# Patient Record
Sex: Female | Born: 1975
Health system: Southern US, Community
[De-identification: ages and names within clinical notes are randomized; demographics above are authoritative.]

## PROBLEM LIST (undated history)

## (undated) DIAGNOSIS — K469 Unspecified abdominal hernia without obstruction or gangrene: Secondary | ICD-10-CM

## (undated) DIAGNOSIS — F32A Depression, unspecified: Secondary | ICD-10-CM

## (undated) DIAGNOSIS — G8929 Other chronic pain: Secondary | ICD-10-CM

## (undated) DIAGNOSIS — R519 Headache, unspecified: Secondary | ICD-10-CM

## (undated) DIAGNOSIS — G030 Nonpyogenic meningitis: Secondary | ICD-10-CM

## (undated) DIAGNOSIS — E039 Hypothyroidism, unspecified: Secondary | ICD-10-CM

## (undated) DIAGNOSIS — G629 Polyneuropathy, unspecified: Secondary | ICD-10-CM

## (undated) DIAGNOSIS — A879 Viral meningitis, unspecified: Secondary | ICD-10-CM

## (undated) DIAGNOSIS — K219 Gastro-esophageal reflux disease without esophagitis: Secondary | ICD-10-CM

## (undated) DIAGNOSIS — M1990 Unspecified osteoarthritis, unspecified site: Secondary | ICD-10-CM

## (undated) DIAGNOSIS — F419 Anxiety disorder, unspecified: Secondary | ICD-10-CM

## (undated) HISTORY — DX: Hypothyroidism, unspecified: E03.9

## (undated) HISTORY — DX: Depression, unspecified: F32.A

## (undated) HISTORY — DX: Polyneuropathy, unspecified: G62.9

## (undated) HISTORY — DX: Viral meningitis, unspecified: A87.9

## (undated) HISTORY — DX: Unspecified osteoarthritis, unspecified site: M19.90

## (undated) HISTORY — PX: CHOLECYSTECTOMY: SHX55

## (undated) HISTORY — DX: Headache, unspecified: R51.9

## (undated) HISTORY — DX: Anxiety disorder, unspecified: F41.9

## (undated) HISTORY — DX: Other chronic pain: G89.29

## (undated) HISTORY — DX: Gastro-esophageal reflux disease without esophagitis: K21.9

## (undated) HISTORY — DX: Nonpyogenic meningitis: G03.0

## (undated) HISTORY — PX: THYROIDECTOMY, PARTIAL: SHX18

---

## 2002-04-30 HISTORY — PX: HERNIA REPAIR: SHX51

## 2008-11-12 ENCOUNTER — Ambulatory Visit (HOSPITAL_COMMUNITY): Admission: RE | Admit: 2008-11-12 | Discharge: 2008-11-12 | Payer: Self-pay | Admitting: Obstetrics and Gynecology

## 2008-12-03 ENCOUNTER — Ambulatory Visit (HOSPITAL_COMMUNITY): Admission: RE | Admit: 2008-12-03 | Discharge: 2008-12-03 | Payer: Self-pay | Admitting: Obstetrics and Gynecology

## 2009-06-28 HISTORY — PX: OTHER SURGICAL HISTORY: SHX169

## 2011-07-24 ENCOUNTER — Encounter (INDEPENDENT_AMBULATORY_CARE_PROVIDER_SITE_OTHER): Payer: Self-pay

## 2011-07-30 ENCOUNTER — Telehealth (INDEPENDENT_AMBULATORY_CARE_PROVIDER_SITE_OTHER): Payer: Self-pay | Admitting: General Surgery

## 2011-07-30 NOTE — Telephone Encounter (Signed)
Pt is calling to let Dr. Luisa Hart know she is experiencing a "burning sensation" at the site of the hernia, and it is tender to the touch.  I explained that Dr. Luisa Hart is on vacation this week, but I would send a note to his e-mail.  She denies that it is incarcerated, and understands if it becomes unable to reduce that she must go to ER immediately.

## 2011-07-31 ENCOUNTER — Emergency Department (HOSPITAL_COMMUNITY)
Admission: EM | Admit: 2011-07-31 | Discharge: 2011-07-31 | Disposition: A | Discharge status: Home / Self Care | Payer: Medicaid Other | Attending: Emergency Medicine | Admitting: Emergency Medicine

## 2011-07-31 ENCOUNTER — Emergency Department (HOSPITAL_COMMUNITY): Payer: Medicaid Other

## 2011-07-31 ENCOUNTER — Encounter (HOSPITAL_COMMUNITY): Payer: Self-pay | Admitting: *Deleted

## 2011-07-31 DIAGNOSIS — R109 Unspecified abdominal pain: Secondary | ICD-10-CM | POA: Insufficient documentation

## 2011-07-31 DIAGNOSIS — K469 Unspecified abdominal hernia without obstruction or gangrene: Secondary | ICD-10-CM

## 2011-07-31 HISTORY — DX: Unspecified abdominal hernia without obstruction or gangrene: K46.9

## 2011-07-31 LAB — CBC
HCT: 39.3 % (ref 36.0–46.0)
Hemoglobin: 13.4 g/dL (ref 12.0–15.0)
MCV: 88.7 fL (ref 78.0–100.0)
Platelets: 221 10*3/uL (ref 150–400)
RBC: 4.43 MIL/uL (ref 3.87–5.11)
WBC: 7.4 10*3/uL (ref 4.0–10.5)

## 2011-07-31 LAB — POCT I-STAT, CHEM 8
BUN: 10 mg/dL (ref 6–23)
Calcium, Ion: 1.26 mmol/L (ref 1.12–1.32)
Chloride: 108 mEq/L (ref 96–112)
Glucose, Bld: 70 mg/dL (ref 70–99)

## 2011-07-31 MED ORDER — MORPHINE SULFATE 4 MG/ML IJ SOLN
4.0000 mg | INTRAMUSCULAR | Status: DC | PRN
  Administered 2011-07-31: 4 mg via INTRAVENOUS
  Filled 2011-07-31: qty 1

## 2011-07-31 MED ORDER — OXYCODONE-ACETAMINOPHEN 5-325 MG PO TABS: 1.0000 | ORAL_TABLET | Freq: Four times a day (QID) | ORAL | Status: DC | PRN

## 2011-07-31 MED ORDER — ONDANSETRON HCL 4 MG/2ML IJ SOLN
4.0000 mg | Freq: Once | INTRAMUSCULAR | Status: AC
  Administered 2011-07-31: 4 mg via INTRAVENOUS
  Filled 2011-07-31: qty 2

## 2011-07-31 NOTE — ED Provider Notes (Signed)
History     CSN: [id number]  Arrival date & time 07/31/11  1330   First MD Initiated Contact with Patient 07/31/11 1451      Chief Complaint  Patient presents with  . Abdominal Pain  . Hernia    (Consider location/radiation/quality/duration/timing/severity/associated sxs/prior treatment) HPI Comments: Patient presents emergency Department with a chief complaint of abdominal hernia.  Patient states she has had surgical repair to this hernia 5 times the most recent surgery performed in September.  Patient has decided to change her surgeon to [facility] and has an appointment on April 8 however she started experiencing abdominal pain 2 days ago.  Patient was advised by the nurse at [facility] to come to the emergency department if abdominal pain worsens.  Patient denies nausea, vomiting, fevers, night sweats, chills.  Exacerbating symptoms of abdominal pain include any movement and certain positions.  Pain does not radiate and is rated at a 8/10 in severity.  Patient has no other complaints at this time.  Patient is a 36 y.o. female presenting with abdominal pain. The history is provided by the patient.  Abdominal Pain The primary symptoms of the illness include abdominal pain. The primary symptoms of the illness do not include fever, shortness of breath, nausea, vomiting, diarrhea, dysuria, vaginal discharge or vaginal bleeding.  Symptoms associated with the illness do not include chills, constipation, urgency or hematuria.    Past Medical History  Diagnosis Date  . Hernia     Past Surgical History  Procedure Date  . Hernia repair 2004  . Cholecystectomy   . Cesarean section     No family history on file.  History  Substance Use Topics  . Smoking status: Current Everyday Smoker  . Smokeless tobacco: Not on file  . Alcohol Use: No    OB History    Grav Para Term Preterm Abortions TAB SAB Ect Mult Living                  Review of Systems  Constitutional:  Negative for fever, chills and appetite change.  HENT: Negative for neck stiffness and dental problem.   Eyes: Negative for visual disturbance.  Respiratory: Negative for cough, chest tightness, shortness of breath and wheezing.   Cardiovascular: Negative for chest pain.  Gastrointestinal: Positive for abdominal pain. Negative for nausea, vomiting, diarrhea, constipation, blood in stool, abdominal distention, anal bleeding and rectal pain.  Genitourinary: Negative for dysuria, urgency, hematuria, flank pain, vaginal bleeding, vaginal discharge and vaginal pain.  Musculoskeletal: Negative for myalgias and arthralgias.  Skin: Negative for rash.  Neurological: Negative for dizziness, syncope, speech difficulty, numbness and headaches.  Hematological: Does not bruise/bleed easily.  All other systems reviewed and are negative.    Allergies  Review of patient's allergies indicates not on file.  Home Medications   Current Outpatient Rx  Name Route Sig Dispense Refill  . CETIRIZINE HCL 10 MG PO TABS Oral Take 10 mg by mouth daily.    Marland Kitchen CLONAZEPAM 0.5 MG PO TABS Oral Take 1 mg by mouth 3 (three) times daily as needed.    Marland Kitchen EPINEPHRINE 0.3 MG/0.3ML IJ DEVI Intramuscular Inject 0.3 mg into the muscle once.    Marland Kitchen FLUOXETINE HCL 40 MG PO CAPS Oral Take 40 mg by mouth daily.    Marland Kitchen HYDROCODONE-ACETAMINOPHEN 5-325 MG PO TABS Oral Take 1 tablet by mouth every 6 (six) hours as needed.      BP 106/75  Pulse 93  Temp(Src) 97.8 F (36.6 C) (  Oral)  Resp 18  Ht 5\' 6"  (1.676 m)  Wt 236 lb (107.049 kg)  BMI 38.09 kg/m2  SpO2 97%  LMP 07/02/2011  Physical Exam  Nursing note and vitals reviewed. Constitutional: Vital signs are normal. She appears well-developed and well-nourished. No distress.  HENT:  Head: Normocephalic and atraumatic.  Mouth/Throat: Uvula is midline, oropharynx is clear and moist and mucous membranes are normal.  Eyes: Conjunctivae and EOM are normal. Pupils are equal, round, and  reactive to light.  Neck: Normal range of motion and full passive range of motion without pain. Neck supple. No spinous process tenderness and no muscular tenderness present. No rigidity. No Brudzinski's sign noted.  Cardiovascular: Normal rate and regular rhythm.   Pulmonary/Chest: Effort normal and breath sounds normal. No accessory muscle usage. Not tachypneic. No respiratory distress.  Abdominal: Soft. Normal appearance. She exhibits no distension, no ascites, no pulsatile midline mass and no mass. There is tenderness. There is no CVA tenderness. No hernia.         Large abdominal hernia, ttp   Lymphadenopathy:    She has no cervical adenopathy.  Neurological: She is alert.  Skin: Skin is warm and dry. No rash noted. She is not diaphoretic.  Psychiatric: She has a normal mood and affect. Her speech is normal and behavior is normal.    ED Course  Procedures (including critical care time)   Labs Reviewed  CBC  POCT I-STAT, CHEM 8   Ct Abdomen Pelvis Wo Contrast  07/31/2011  *RADIOLOGY REPORT*  Clinical Data: Mid abdominal pain.  History of hernia.  CT ABDOMEN AND PELVIS WITHOUT CONTRAST  Technique:  Multidetector CT imaging of the abdomen and pelvis was performed following the standard protocol without intravenous contrast.  Comparison: 07/15/2007  Findings: Lung bases are clear.  No effusions.  Heart is normal size.  Prior cholecystectomy.  Liver, spleen, pancreas, adrenals and kidneys have an unremarkable unenhanced appearance.  Changes of prior anterior abdominal wall hernia repair.  Recurrent hernia inferiorly within the lower pelvis ventral wall containing small bowel loops.  There is fluid adjacent to the hernia sac, but this appears to be outside the hernia within the subcutaneous soft tissues.  This fluid is new since prior study.  No evidence of bowel obstruction.  Stomach and large bowel unremarkable.  Uterus, adnexa and urinary bladder have an unremarkable unenhanced appearance.   No acute bony abnormality.  IMPRESSION: Ventral hernia in the lower pelvic wall below the prior hernia repair clips.  This contains small bowel loops without evidence of obstruction.  Fluid is noted around the hernia sac, but it appears to be outside the sac within the subcutaneous soft tissues.  This is of unknown etiology or significance.  This could be reactive or related to postoperative seroma.  Original Report Authenticated By: Cyndie Chime, M.D.     No diagnosis found.    MDM  Hernia Patient with ventral abdominal hernia.  CT shows no evidence of obstruction.  Patients pain managed in the emergency department with morphine.  [facility] consult it and spoke with Dr. Johna Sheriff who is comfortable with patient keeping her appointment on April 8.  Patient will be sent home with Percocet for pain management patient is agreeable with plan.        Jaci Carrel, [place] 07/31/11 1717

## 2011-07-31 NOTE — ED Notes (Signed)
Pt has a lower abdominal hernia. Pt states she has had 6 hernias in the past 2 years. Pt states her surgeon told her to come into the ed if she was unable to push the hernia back into place. Pt states she is having more frequent urination.

## 2011-07-31 NOTE — Discharge Instructions (Signed)
Keep her followup appointment scheduled with [facility] for April 8.  As discussed return to the emergency department if he were hernia worsens in any way including pain not being controlled by the medications given, hardening of the hernia, or associated symptoms including vomiting.  Take medication as prescribed and to not operate heavy machinery within 4 hours and to use medication.  Hernia A hernia occurs when an internal organ pushes out through a weak spot in the abdominal wall. Hernias most commonly occur in the groin and around the navel. Hernias often can be pushed back into place (reduced). Most hernias tend to get worse over time. Some abdominal hernias can get stuck in the opening (irreducible or incarcerated hernia) and cannot be reduced. An irreducible abdominal hernia which is tightly squeezed into the opening is at risk for impaired blood supply (strangulated hernia). A strangulated hernia is a medical emergency. Because of the risk for an irreducible or strangulated hernia, surgery may be recommended to repair a hernia. CAUSES   Heavy lifting.   Prolonged coughing.   Straining to have a bowel movement.   A cut (incision) made during an abdominal surgery.  HOME CARE INSTRUCTIONS   Bed rest is not required. You may continue your normal activities.   Avoid lifting more than 10 pounds (4.5 kg) or straining.   Cough gently. If you are a smoker it is best to stop. Even the best hernia repair can break down with the continual strain of coughing. Even if you do not have your hernia repaired, a cough will continue to aggravate the problem.   Do not wear anything tight over your hernia. Do not try to keep it in with an outside bandage or truss. These can damage abdominal contents if they are trapped within the hernia sac.   Eat a normal diet.   Avoid constipation. Straining over long periods of time will increase hernia size and encourage breakdown of repairs. If you cannot do  this with diet alone, stool softeners may be used.  SEEK IMMEDIATE MEDICAL CARE IF:   You have a fever.   You develop increasing abdominal pain.   You feel nauseous or vomit.   Your hernia is stuck outside the abdomen, looks discolored, feels hard, or is tender.   You have any changes in your bowel habits or in the hernia that are unusual for you.   You have increased pain or swelling around the hernia.   You cannot push the hernia back in place by applying gentle pressure while lying down.  MAKE SURE YOU:   Understand these instructions.   Will watch your condition.   Will get help right away if you are not doing well or get worse.  Document Released: 04/16/2005 Document Revised: 04/05/2011 Document Reviewed: 12/04/2007 [facility] Patient Information 2012 [place], [place].

## 2011-08-03 NOTE — ED Provider Notes (Signed)
Medical screening examination/treatment/procedure(s) were performed by non-physician practitioner and as supervising physician I was immediately available for consultation/collaboration.   Ann Perez. Oletta Lamas, MD 08/03/11 2029

## 2011-08-06 ENCOUNTER — Other Ambulatory Visit (INDEPENDENT_AMBULATORY_CARE_PROVIDER_SITE_OTHER): Payer: Self-pay

## 2011-08-06 ENCOUNTER — Ambulatory Visit (INDEPENDENT_AMBULATORY_CARE_PROVIDER_SITE_OTHER): Payer: Medicaid Other | Admitting: Surgery

## 2011-08-06 ENCOUNTER — Encounter (INDEPENDENT_AMBULATORY_CARE_PROVIDER_SITE_OTHER): Payer: Self-pay | Admitting: Surgery

## 2011-08-06 VITALS — BP 110/77 | HR 77 | Temp 97.2°F | Ht 66.5 in | Wt 259.4 lb

## 2011-08-06 DIAGNOSIS — K432 Incisional hernia without obstruction or gangrene: Secondary | ICD-10-CM

## 2011-08-06 DIAGNOSIS — K439 Ventral hernia without obstruction or gangrene: Secondary | ICD-10-CM

## 2011-08-06 MED ORDER — OXYCODONE-ACETAMINOPHEN 10-325 MG PO TABS: 1.0000 | ORAL_TABLET | Freq: Four times a day (QID) | ORAL | Status: AC | PRN

## 2011-08-06 NOTE — Patient Instructions (Signed)
You will be referred to [facility] to see hernia expert.  Office will call you with info in next 1 -2 days.

## 2011-08-06 NOTE — Progress Notes (Signed)
Patient ID: Ann Perez, female   DOB: 02/02/1976, 36 y.o.   MRN: [id number]  Chief Complaint  Patient presents with  . Pre-op Exam    eval hernia    HPI Ann Perez is a 36 y.o. female.   HPI Patient sent at the request of Dr. Amada Kingfisher due to recurrent ventral hernia. The patient has had multiple ventral hernia repairs since 2004. She has had repairs in each of the last 5 years. To the operative reports come with her today. Her last repair was September 2012 in [place]. She had a previous C-section and has had multiple repairs of either of umbilical or incisional hernia since 2004. She's had a laparoscopic repair in 2009. In 2011 she had back mesh removed and underwent a panniculectomy with repair using a biologic prosthesis and abdominoplasty in [place]. Her hernias recurred in her lower abdomen and was seen last week at [facility] long where CT scan showed recurrence of hernia just above the pubis-containing small bowel contents without signs of incarceration or strangulation. She still has little bowel pain which is chronic. This comes and goes but is made worse with exertion. No change in bowel or bladder function.  Past Medical History  Diagnosis Date  . Hernia     Past Surgical History  Procedure Date  . Hernia repair 2004  . Cholecystectomy   . Cesarean section   . Paniculectomy 06/2009    Family History  Problem Relation Age of Onset  . Cancer Maternal Grandmother     breast  . Cancer Maternal Grandfather   . Cancer Paternal Grandmother     brain  . Cancer Cousin     ovarian    Social History History  Substance Use Topics  . Smoking status: Current Everyday Smoker -- 1.0 packs/day    Types: Cigarettes  . Smokeless tobacco: Not on file  . Alcohol Use: No    Allergies  Allergen Reactions  . Tape Hives and Rash    Steri strips    Current Outpatient Prescriptions  Medication Sig Dispense Refill  . cetirizine (ZYRTEC) 10 MG tablet Take 10 mg by mouth daily.       . clonazePAM (KLONOPIN) 0.5 MG tablet Take 1 mg by mouth 3 (three) times daily as needed.      Marland Kitchen EPINEPHrine (EPIPEN) 0.3 mg/0.3 mL DEVI Inject 0.3 mg into the muscle once.      Marland Kitchen FLUoxetine (PROZAC) 40 MG capsule Take 40 mg by mouth daily.      Marland Kitchen HYDROcodone-acetaminophen (NORCO) 5-325 MG per tablet Take 1 tablet by mouth every 6 (six) hours as needed.      Marland Kitchen oxyCODONE-acetaminophen (PERCOCET) 10-325 MG per tablet Take 1 tablet by mouth every 6 (six) hours as needed for pain.  30 tablet  0    Review of Systems Review of Systems  Constitutional: Negative for fever, chills and unexpected weight change.  HENT: Negative for hearing loss, congestion, sore throat, trouble swallowing and voice change.   Eyes: Negative for visual disturbance.  Respiratory: Negative for cough and wheezing.   Cardiovascular: Negative for chest pain, palpitations and leg swelling.  Gastrointestinal: Negative for nausea, vomiting, abdominal pain, diarrhea, constipation, blood in stool, abdominal distention and anal bleeding.  Genitourinary: Negative for hematuria, vaginal bleeding and difficulty urinating.  Musculoskeletal: Negative for arthralgias.  Skin: Negative for rash and wound.  Neurological: Negative for seizures, syncope and headaches.  Hematological: Negative for adenopathy. Does not bruise/bleed easily.  Psychiatric/Behavioral: Negative for confusion.  Blood pressure 110/77, pulse 77, temperature 97.2 F (36.2 C), temperature source Temporal, height 5' 6.5" (1.689 m), weight 259 lb 6.4 oz (117.663 kg), last menstrual period 07/02/2011, SpO2 98.00%.  Physical Exam Physical Exam  Constitutional: She appears well-developed and well-nourished.  HENT:  Head: Normocephalic and atraumatic.  Eyes: EOM are normal. Pupils are equal, round, and reactive to light.  Neck: Normal range of motion.  Cardiovascular: Normal rate and regular rhythm.   Pulmonary/Chest: Effort normal and breath sounds normal.    Abdominal:    Skin: Skin is warm and dry.  Psychiatric: She has a normal mood and affect. Her speech is normal and behavior is normal. Thought content normal.    Data Reviewed CT 07/31/2011  Recurrent incisional hernia containing small bowel above pubis.    Assessment    Recurrent incisional hernia after multiple repairs in [place]    Plan    Recommend referral to [facility]  For Hernia expert given multiple failed attempts and techniques.        Ann Perez A. 08/06/2011, 11:20 AM

## 2011-08-09 ENCOUNTER — Telehealth (INDEPENDENT_AMBULATORY_CARE_PROVIDER_SITE_OTHER): Payer: Self-pay

## 2011-08-09 NOTE — Telephone Encounter (Signed)
Left message for patient to call back to let her know due to her insurance she will have to have PCP put referral in to see Dr. Barnetta Chapel at [facility] for colorectal specialist.

## 2016-07-11 DIAGNOSIS — J0120 Acute ethmoidal sinusitis, unspecified: Secondary | ICD-10-CM | POA: Diagnosis not present

## 2016-07-11 DIAGNOSIS — G8929 Other chronic pain: Secondary | ICD-10-CM | POA: Diagnosis not present

## 2016-07-11 DIAGNOSIS — G43909 Migraine, unspecified, not intractable, without status migrainosus: Secondary | ICD-10-CM | POA: Diagnosis not present

## 2016-07-11 DIAGNOSIS — M549 Dorsalgia, unspecified: Secondary | ICD-10-CM | POA: Diagnosis not present

## 2016-10-22 DIAGNOSIS — G894 Chronic pain syndrome: Secondary | ICD-10-CM | POA: Diagnosis not present

## 2016-10-22 DIAGNOSIS — M5416 Radiculopathy, lumbar region: Secondary | ICD-10-CM | POA: Diagnosis not present

## 2016-10-22 DIAGNOSIS — M542 Cervicalgia: Secondary | ICD-10-CM | POA: Diagnosis not present

## 2016-10-22 DIAGNOSIS — M5090 Cervical disc disorder, unspecified, unspecified cervical region: Secondary | ICD-10-CM | POA: Diagnosis not present

## 2016-10-22 DIAGNOSIS — M519 Unspecified thoracic, thoracolumbar and lumbosacral intervertebral disc disorder: Secondary | ICD-10-CM | POA: Diagnosis not present

## 2016-10-22 DIAGNOSIS — E663 Overweight: Secondary | ICD-10-CM | POA: Diagnosis not present

## 2017-01-10 DIAGNOSIS — G43909 Migraine, unspecified, not intractable, without status migrainosus: Secondary | ICD-10-CM | POA: Diagnosis not present

## 2017-01-10 DIAGNOSIS — M549 Dorsalgia, unspecified: Secondary | ICD-10-CM | POA: Diagnosis not present

## 2017-01-10 DIAGNOSIS — F331 Major depressive disorder, recurrent, moderate: Secondary | ICD-10-CM | POA: Diagnosis not present

## 2017-01-10 DIAGNOSIS — G8929 Other chronic pain: Secondary | ICD-10-CM | POA: Diagnosis not present

## 2017-02-28 DIAGNOSIS — J0100 Acute maxillary sinusitis, unspecified: Secondary | ICD-10-CM | POA: Diagnosis not present

## 2017-05-24 DIAGNOSIS — M519 Unspecified thoracic, thoracolumbar and lumbosacral intervertebral disc disorder: Secondary | ICD-10-CM | POA: Diagnosis not present

## 2017-05-24 DIAGNOSIS — G894 Chronic pain syndrome: Secondary | ICD-10-CM | POA: Diagnosis not present

## 2017-05-24 DIAGNOSIS — M5412 Radiculopathy, cervical region: Secondary | ICD-10-CM | POA: Diagnosis not present

## 2017-05-24 DIAGNOSIS — M5090 Cervical disc disorder, unspecified, unspecified cervical region: Secondary | ICD-10-CM | POA: Diagnosis not present

## 2017-05-24 DIAGNOSIS — E663 Overweight: Secondary | ICD-10-CM | POA: Diagnosis not present

## 2017-05-24 DIAGNOSIS — M542 Cervicalgia: Secondary | ICD-10-CM | POA: Diagnosis not present

## 2017-05-24 DIAGNOSIS — M5416 Radiculopathy, lumbar region: Secondary | ICD-10-CM | POA: Diagnosis not present

## 2017-06-19 DIAGNOSIS — G894 Chronic pain syndrome: Secondary | ICD-10-CM | POA: Diagnosis not present

## 2017-06-19 DIAGNOSIS — M542 Cervicalgia: Secondary | ICD-10-CM | POA: Diagnosis not present

## 2017-06-19 DIAGNOSIS — M5416 Radiculopathy, lumbar region: Secondary | ICD-10-CM | POA: Diagnosis not present

## 2017-06-19 DIAGNOSIS — M5412 Radiculopathy, cervical region: Secondary | ICD-10-CM | POA: Diagnosis not present

## 2017-06-19 DIAGNOSIS — M5090 Cervical disc disorder, unspecified, unspecified cervical region: Secondary | ICD-10-CM | POA: Diagnosis not present

## 2017-06-19 DIAGNOSIS — M519 Unspecified thoracic, thoracolumbar and lumbosacral intervertebral disc disorder: Secondary | ICD-10-CM | POA: Diagnosis not present

## 2017-06-19 DIAGNOSIS — E663 Overweight: Secondary | ICD-10-CM | POA: Diagnosis not present

## 2017-09-27 DIAGNOSIS — M21612 Bunion of left foot: Secondary | ICD-10-CM | POA: Diagnosis not present

## 2017-09-27 DIAGNOSIS — Z85850 Personal history of malignant neoplasm of thyroid: Secondary | ICD-10-CM | POA: Diagnosis not present

## 2017-09-27 DIAGNOSIS — G43909 Migraine, unspecified, not intractable, without status migrainosus: Secondary | ICD-10-CM | POA: Diagnosis not present

## 2017-09-27 DIAGNOSIS — G8929 Other chronic pain: Secondary | ICD-10-CM | POA: Diagnosis not present

## 2017-09-27 DIAGNOSIS — M549 Dorsalgia, unspecified: Secondary | ICD-10-CM | POA: Diagnosis not present

## 2017-10-07 DIAGNOSIS — F17200 Nicotine dependence, unspecified, uncomplicated: Secondary | ICD-10-CM | POA: Diagnosis not present

## 2017-10-07 DIAGNOSIS — Z9889 Other specified postprocedural states: Secondary | ICD-10-CM | POA: Diagnosis not present

## 2017-10-07 DIAGNOSIS — Z85850 Personal history of malignant neoplasm of thyroid: Secondary | ICD-10-CM | POA: Diagnosis not present

## 2017-10-07 DIAGNOSIS — E039 Hypothyroidism, unspecified: Secondary | ICD-10-CM | POA: Diagnosis not present

## 2017-10-09 DIAGNOSIS — M2011 Hallux valgus (acquired), right foot: Secondary | ICD-10-CM | POA: Diagnosis not present

## 2017-10-09 DIAGNOSIS — M2012 Hallux valgus (acquired), left foot: Secondary | ICD-10-CM | POA: Diagnosis not present

## 2017-10-10 DIAGNOSIS — G8929 Other chronic pain: Secondary | ICD-10-CM | POA: Diagnosis not present

## 2017-10-10 DIAGNOSIS — E039 Hypothyroidism, unspecified: Secondary | ICD-10-CM | POA: Diagnosis not present

## 2017-10-10 DIAGNOSIS — Z6838 Body mass index (BMI) 38.0-38.9, adult: Secondary | ICD-10-CM | POA: Diagnosis not present

## 2017-10-10 DIAGNOSIS — M549 Dorsalgia, unspecified: Secondary | ICD-10-CM | POA: Diagnosis not present

## 2017-10-18 DIAGNOSIS — E041 Nontoxic single thyroid nodule: Secondary | ICD-10-CM | POA: Diagnosis not present

## 2017-10-18 DIAGNOSIS — Z85850 Personal history of malignant neoplasm of thyroid: Secondary | ICD-10-CM | POA: Diagnosis not present

## 2017-10-23 DIAGNOSIS — M4724 Other spondylosis with radiculopathy, thoracic region: Secondary | ICD-10-CM | POA: Diagnosis not present

## 2017-10-23 DIAGNOSIS — M129 Arthropathy, unspecified: Secondary | ICD-10-CM | POA: Diagnosis not present

## 2017-10-23 DIAGNOSIS — M5136 Other intervertebral disc degeneration, lumbar region: Secondary | ICD-10-CM | POA: Diagnosis not present

## 2017-10-23 DIAGNOSIS — Z79899 Other long term (current) drug therapy: Secondary | ICD-10-CM | POA: Diagnosis not present

## 2017-10-23 DIAGNOSIS — F17210 Nicotine dependence, cigarettes, uncomplicated: Secondary | ICD-10-CM | POA: Diagnosis not present

## 2017-10-23 DIAGNOSIS — M5090 Cervical disc disorder, unspecified, unspecified cervical region: Secondary | ICD-10-CM | POA: Diagnosis not present

## 2017-11-04 DIAGNOSIS — Z85850 Personal history of malignant neoplasm of thyroid: Secondary | ICD-10-CM | POA: Diagnosis not present

## 2017-11-05 DIAGNOSIS — Z720 Tobacco use: Secondary | ICD-10-CM | POA: Diagnosis not present

## 2017-11-05 DIAGNOSIS — J209 Acute bronchitis, unspecified: Secondary | ICD-10-CM | POA: Diagnosis not present

## 2017-11-05 DIAGNOSIS — Z6836 Body mass index (BMI) 36.0-36.9, adult: Secondary | ICD-10-CM | POA: Diagnosis not present

## 2017-11-25 DIAGNOSIS — Z79899 Other long term (current) drug therapy: Secondary | ICD-10-CM | POA: Diagnosis not present

## 2017-11-25 DIAGNOSIS — G8929 Other chronic pain: Secondary | ICD-10-CM | POA: Diagnosis not present

## 2017-11-25 DIAGNOSIS — K589 Irritable bowel syndrome without diarrhea: Secondary | ICD-10-CM | POA: Diagnosis not present

## 2017-11-25 DIAGNOSIS — M545 Low back pain: Secondary | ICD-10-CM | POA: Diagnosis not present

## 2017-12-26 DIAGNOSIS — M545 Low back pain: Secondary | ICD-10-CM | POA: Diagnosis not present

## 2017-12-26 DIAGNOSIS — Z79899 Other long term (current) drug therapy: Secondary | ICD-10-CM | POA: Diagnosis not present

## 2017-12-26 DIAGNOSIS — G8929 Other chronic pain: Secondary | ICD-10-CM | POA: Diagnosis not present

## 2017-12-26 DIAGNOSIS — K589 Irritable bowel syndrome without diarrhea: Secondary | ICD-10-CM | POA: Diagnosis not present

## 2018-01-11 DIAGNOSIS — J029 Acute pharyngitis, unspecified: Secondary | ICD-10-CM | POA: Diagnosis not present

## 2018-01-23 DIAGNOSIS — M545 Low back pain: Secondary | ICD-10-CM | POA: Diagnosis not present

## 2018-01-23 DIAGNOSIS — G8929 Other chronic pain: Secondary | ICD-10-CM | POA: Diagnosis not present

## 2018-01-23 DIAGNOSIS — E668 Other obesity: Secondary | ICD-10-CM | POA: Diagnosis not present

## 2018-01-23 DIAGNOSIS — Z79899 Other long term (current) drug therapy: Secondary | ICD-10-CM | POA: Diagnosis not present

## 2018-02-20 ENCOUNTER — Other Ambulatory Visit (HOSPITAL_BASED_OUTPATIENT_CLINIC_OR_DEPARTMENT_OTHER): Payer: Self-pay | Admitting: Family Medicine

## 2018-02-20 ENCOUNTER — Ambulatory Visit (HOSPITAL_BASED_OUTPATIENT_CLINIC_OR_DEPARTMENT_OTHER)
Admission: RE | Admit: 2018-02-20 | Discharge: 2018-02-20 | Disposition: A | Discharge status: Home / Self Care | Payer: Medicare Other | Source: Ambulatory Visit | Attending: Family Medicine | Admitting: Family Medicine

## 2018-02-20 ENCOUNTER — Encounter (HOSPITAL_BASED_OUTPATIENT_CLINIC_OR_DEPARTMENT_OTHER): Payer: Self-pay

## 2018-02-20 DIAGNOSIS — M545 Low back pain: Secondary | ICD-10-CM | POA: Diagnosis not present

## 2018-02-20 DIAGNOSIS — Z1231 Encounter for screening mammogram for malignant neoplasm of breast: Secondary | ICD-10-CM | POA: Insufficient documentation

## 2018-02-20 DIAGNOSIS — Z79899 Other long term (current) drug therapy: Secondary | ICD-10-CM | POA: Diagnosis not present

## 2018-02-20 DIAGNOSIS — G8929 Other chronic pain: Secondary | ICD-10-CM | POA: Diagnosis not present

## 2018-02-20 DIAGNOSIS — E668 Other obesity: Secondary | ICD-10-CM | POA: Diagnosis not present

## 2018-03-21 DIAGNOSIS — Z79899 Other long term (current) drug therapy: Secondary | ICD-10-CM | POA: Diagnosis not present

## 2018-04-03 DIAGNOSIS — H66009 Acute suppurative otitis media without spontaneous rupture of ear drum, unspecified ear: Secondary | ICD-10-CM | POA: Diagnosis not present

## 2018-04-18 DIAGNOSIS — R945 Abnormal results of liver function studies: Secondary | ICD-10-CM | POA: Diagnosis not present

## 2018-04-18 DIAGNOSIS — E559 Vitamin D deficiency, unspecified: Secondary | ICD-10-CM | POA: Diagnosis not present

## 2018-04-18 DIAGNOSIS — M129 Arthropathy, unspecified: Secondary | ICD-10-CM | POA: Diagnosis not present

## 2018-04-18 DIAGNOSIS — R5383 Other fatigue: Secondary | ICD-10-CM | POA: Diagnosis not present

## 2018-04-18 DIAGNOSIS — Z79899 Other long term (current) drug therapy: Secondary | ICD-10-CM | POA: Diagnosis not present

## 2018-04-24 DIAGNOSIS — J0300 Acute streptococcal tonsillitis, unspecified: Secondary | ICD-10-CM | POA: Diagnosis not present

## 2018-05-13 DIAGNOSIS — K148 Other diseases of tongue: Secondary | ICD-10-CM | POA: Diagnosis not present

## 2018-05-13 DIAGNOSIS — Z720 Tobacco use: Secondary | ICD-10-CM | POA: Diagnosis not present

## 2018-05-13 DIAGNOSIS — E039 Hypothyroidism, unspecified: Secondary | ICD-10-CM | POA: Diagnosis not present

## 2018-05-13 DIAGNOSIS — H6522 Chronic serous otitis media, left ear: Secondary | ICD-10-CM | POA: Diagnosis not present

## 2018-05-19 DIAGNOSIS — Z79899 Other long term (current) drug therapy: Secondary | ICD-10-CM | POA: Diagnosis not present

## 2018-06-09 DIAGNOSIS — F17200 Nicotine dependence, unspecified, uncomplicated: Secondary | ICD-10-CM | POA: Diagnosis not present

## 2018-06-09 DIAGNOSIS — J342 Deviated nasal septum: Secondary | ICD-10-CM | POA: Diagnosis not present

## 2018-06-09 DIAGNOSIS — J383 Other diseases of vocal cords: Secondary | ICD-10-CM | POA: Diagnosis not present

## 2018-06-09 DIAGNOSIS — Z85850 Personal history of malignant neoplasm of thyroid: Secondary | ICD-10-CM | POA: Diagnosis not present

## 2018-06-09 DIAGNOSIS — D101 Benign neoplasm of tongue: Secondary | ICD-10-CM | POA: Diagnosis not present

## 2018-06-09 DIAGNOSIS — R490 Dysphonia: Secondary | ICD-10-CM | POA: Diagnosis not present

## 2018-06-09 DIAGNOSIS — H938X9 Other specified disorders of ear, unspecified ear: Secondary | ICD-10-CM | POA: Diagnosis not present

## 2018-06-19 DIAGNOSIS — F17210 Nicotine dependence, cigarettes, uncomplicated: Secondary | ICD-10-CM | POA: Diagnosis not present

## 2018-06-19 DIAGNOSIS — G8929 Other chronic pain: Secondary | ICD-10-CM | POA: Diagnosis not present

## 2018-06-19 DIAGNOSIS — R0602 Shortness of breath: Secondary | ICD-10-CM | POA: Diagnosis not present

## 2018-06-19 DIAGNOSIS — M549 Dorsalgia, unspecified: Secondary | ICD-10-CM | POA: Diagnosis not present

## 2018-06-19 DIAGNOSIS — A5903 Trichomonal cystitis and urethritis: Secondary | ICD-10-CM | POA: Diagnosis not present

## 2018-06-19 DIAGNOSIS — Z3200 Encounter for pregnancy test, result unknown: Secondary | ICD-10-CM | POA: Diagnosis not present

## 2018-06-19 DIAGNOSIS — R635 Abnormal weight gain: Secondary | ICD-10-CM | POA: Diagnosis not present

## 2018-06-19 DIAGNOSIS — Z79899 Other long term (current) drug therapy: Secondary | ICD-10-CM | POA: Diagnosis not present

## 2018-06-19 DIAGNOSIS — J45909 Unspecified asthma, uncomplicated: Secondary | ICD-10-CM | POA: Diagnosis not present

## 2018-06-19 DIAGNOSIS — M545 Low back pain: Secondary | ICD-10-CM | POA: Diagnosis not present

## 2018-06-27 DIAGNOSIS — M25572 Pain in left ankle and joints of left foot: Secondary | ICD-10-CM | POA: Diagnosis not present

## 2018-07-01 DIAGNOSIS — Z85850 Personal history of malignant neoplasm of thyroid: Secondary | ICD-10-CM | POA: Diagnosis not present

## 2018-07-18 DIAGNOSIS — M545 Low back pain: Secondary | ICD-10-CM | POA: Diagnosis not present

## 2018-07-18 DIAGNOSIS — E559 Vitamin D deficiency, unspecified: Secondary | ICD-10-CM | POA: Diagnosis not present

## 2018-07-18 DIAGNOSIS — Z79899 Other long term (current) drug therapy: Secondary | ICD-10-CM | POA: Diagnosis not present

## 2018-07-18 DIAGNOSIS — E611 Iron deficiency: Secondary | ICD-10-CM | POA: Diagnosis not present

## 2018-07-18 DIAGNOSIS — Z87891 Personal history of nicotine dependence: Secondary | ICD-10-CM | POA: Diagnosis not present

## 2018-07-18 DIAGNOSIS — E7800 Pure hypercholesterolemia, unspecified: Secondary | ICD-10-CM | POA: Diagnosis not present

## 2018-07-18 DIAGNOSIS — R0602 Shortness of breath: Secondary | ICD-10-CM | POA: Diagnosis not present

## 2018-07-18 DIAGNOSIS — F17200 Nicotine dependence, unspecified, uncomplicated: Secondary | ICD-10-CM | POA: Diagnosis not present

## 2018-07-18 DIAGNOSIS — Z3200 Encounter for pregnancy test, result unknown: Secondary | ICD-10-CM | POA: Diagnosis not present

## 2018-07-23 DIAGNOSIS — D380 Neoplasm of uncertain behavior of larynx: Secondary | ICD-10-CM | POA: Diagnosis not present

## 2018-07-23 DIAGNOSIS — F17200 Nicotine dependence, unspecified, uncomplicated: Secondary | ICD-10-CM | POA: Diagnosis not present

## 2018-07-23 DIAGNOSIS — D3702 Neoplasm of uncertain behavior of tongue: Secondary | ICD-10-CM | POA: Diagnosis not present

## 2018-07-23 DIAGNOSIS — R490 Dysphonia: Secondary | ICD-10-CM | POA: Diagnosis not present

## 2018-07-23 DIAGNOSIS — D101 Benign neoplasm of tongue: Secondary | ICD-10-CM | POA: Diagnosis not present

## 2018-07-23 DIAGNOSIS — R599 Enlarged lymph nodes, unspecified: Secondary | ICD-10-CM | POA: Diagnosis not present

## 2018-07-23 DIAGNOSIS — J381 Polyp of vocal cord and larynx: Secondary | ICD-10-CM | POA: Diagnosis not present

## 2018-07-23 DIAGNOSIS — E039 Hypothyroidism, unspecified: Secondary | ICD-10-CM | POA: Diagnosis not present

## 2018-07-23 DIAGNOSIS — F17210 Nicotine dependence, cigarettes, uncomplicated: Secondary | ICD-10-CM | POA: Diagnosis not present

## 2018-07-23 DIAGNOSIS — J383 Other diseases of vocal cords: Secondary | ICD-10-CM | POA: Diagnosis not present

## 2018-07-29 DIAGNOSIS — J019 Acute sinusitis, unspecified: Secondary | ICD-10-CM | POA: Diagnosis not present

## 2018-07-29 DIAGNOSIS — Z9889 Other specified postprocedural states: Secondary | ICD-10-CM | POA: Diagnosis not present

## 2018-07-29 DIAGNOSIS — J383 Other diseases of vocal cords: Secondary | ICD-10-CM | POA: Diagnosis not present

## 2018-07-29 DIAGNOSIS — R490 Dysphonia: Secondary | ICD-10-CM | POA: Diagnosis not present

## 2018-07-29 DIAGNOSIS — F17200 Nicotine dependence, unspecified, uncomplicated: Secondary | ICD-10-CM | POA: Diagnosis not present

## 2018-08-18 DIAGNOSIS — G8929 Other chronic pain: Secondary | ICD-10-CM | POA: Diagnosis not present

## 2018-08-18 DIAGNOSIS — F17210 Nicotine dependence, cigarettes, uncomplicated: Secondary | ICD-10-CM | POA: Diagnosis not present

## 2018-08-18 DIAGNOSIS — M545 Low back pain: Secondary | ICD-10-CM | POA: Diagnosis not present

## 2018-08-18 DIAGNOSIS — R0602 Shortness of breath: Secondary | ICD-10-CM | POA: Diagnosis not present

## 2018-08-18 DIAGNOSIS — E669 Obesity, unspecified: Secondary | ICD-10-CM | POA: Diagnosis not present

## 2018-08-18 DIAGNOSIS — Z79899 Other long term (current) drug therapy: Secondary | ICD-10-CM | POA: Diagnosis not present

## 2018-09-17 DIAGNOSIS — F329 Major depressive disorder, single episode, unspecified: Secondary | ICD-10-CM | POA: Diagnosis not present

## 2018-09-17 DIAGNOSIS — G8929 Other chronic pain: Secondary | ICD-10-CM | POA: Diagnosis not present

## 2018-09-17 DIAGNOSIS — F17210 Nicotine dependence, cigarettes, uncomplicated: Secondary | ICD-10-CM | POA: Diagnosis not present

## 2018-09-17 DIAGNOSIS — Z79899 Other long term (current) drug therapy: Secondary | ICD-10-CM | POA: Diagnosis not present

## 2018-09-17 DIAGNOSIS — R635 Abnormal weight gain: Secondary | ICD-10-CM | POA: Diagnosis not present

## 2018-09-17 DIAGNOSIS — R0602 Shortness of breath: Secondary | ICD-10-CM | POA: Diagnosis not present

## 2018-09-17 DIAGNOSIS — M545 Low back pain: Secondary | ICD-10-CM | POA: Diagnosis not present

## 2018-09-17 DIAGNOSIS — Z3200 Encounter for pregnancy test, result unknown: Secondary | ICD-10-CM | POA: Diagnosis not present

## 2018-10-17 DIAGNOSIS — G8929 Other chronic pain: Secondary | ICD-10-CM | POA: Diagnosis not present

## 2018-10-17 DIAGNOSIS — E7800 Pure hypercholesterolemia, unspecified: Secondary | ICD-10-CM | POA: Diagnosis not present

## 2018-10-17 DIAGNOSIS — M545 Low back pain: Secondary | ICD-10-CM | POA: Diagnosis not present

## 2018-10-17 DIAGNOSIS — Z79891 Long term (current) use of opiate analgesic: Secondary | ICD-10-CM | POA: Diagnosis not present

## 2018-10-17 DIAGNOSIS — F17210 Nicotine dependence, cigarettes, uncomplicated: Secondary | ICD-10-CM | POA: Diagnosis not present

## 2018-10-17 DIAGNOSIS — Z1159 Encounter for screening for other viral diseases: Secondary | ICD-10-CM | POA: Diagnosis not present

## 2018-10-17 DIAGNOSIS — R0602 Shortness of breath: Secondary | ICD-10-CM | POA: Diagnosis not present

## 2018-10-17 DIAGNOSIS — Z79899 Other long term (current) drug therapy: Secondary | ICD-10-CM | POA: Diagnosis not present

## 2018-11-17 DIAGNOSIS — Z79899 Other long term (current) drug therapy: Secondary | ICD-10-CM | POA: Diagnosis not present

## 2018-12-18 DIAGNOSIS — Z3200 Encounter for pregnancy test, result unknown: Secondary | ICD-10-CM | POA: Diagnosis not present

## 2018-12-18 DIAGNOSIS — Z1159 Encounter for screening for other viral diseases: Secondary | ICD-10-CM | POA: Diagnosis not present

## 2018-12-18 DIAGNOSIS — F17210 Nicotine dependence, cigarettes, uncomplicated: Secondary | ICD-10-CM | POA: Diagnosis not present

## 2018-12-18 DIAGNOSIS — G8929 Other chronic pain: Secondary | ICD-10-CM | POA: Diagnosis not present

## 2018-12-18 DIAGNOSIS — Z79899 Other long term (current) drug therapy: Secondary | ICD-10-CM | POA: Diagnosis not present

## 2018-12-18 DIAGNOSIS — R0602 Shortness of breath: Secondary | ICD-10-CM | POA: Diagnosis not present

## 2018-12-18 DIAGNOSIS — M545 Low back pain: Secondary | ICD-10-CM | POA: Diagnosis not present

## 2019-01-15 DIAGNOSIS — R0602 Shortness of breath: Secondary | ICD-10-CM | POA: Diagnosis not present

## 2019-01-15 DIAGNOSIS — M545 Low back pain: Secondary | ICD-10-CM | POA: Diagnosis not present

## 2019-01-15 DIAGNOSIS — R635 Abnormal weight gain: Secondary | ICD-10-CM | POA: Diagnosis not present

## 2019-01-15 DIAGNOSIS — F329 Major depressive disorder, single episode, unspecified: Secondary | ICD-10-CM | POA: Diagnosis not present

## 2019-01-15 DIAGNOSIS — G8929 Other chronic pain: Secondary | ICD-10-CM | POA: Diagnosis not present

## 2019-01-15 DIAGNOSIS — F17210 Nicotine dependence, cigarettes, uncomplicated: Secondary | ICD-10-CM | POA: Diagnosis not present

## 2019-01-15 DIAGNOSIS — Z79899 Other long term (current) drug therapy: Secondary | ICD-10-CM | POA: Diagnosis not present

## 2019-02-11 DIAGNOSIS — M545 Low back pain: Secondary | ICD-10-CM | POA: Diagnosis not present

## 2019-02-12 DIAGNOSIS — J342 Deviated nasal septum: Secondary | ICD-10-CM | POA: Diagnosis not present

## 2019-02-12 DIAGNOSIS — F17200 Nicotine dependence, unspecified, uncomplicated: Secondary | ICD-10-CM | POA: Diagnosis not present

## 2019-02-12 DIAGNOSIS — J383 Other diseases of vocal cords: Secondary | ICD-10-CM | POA: Diagnosis not present

## 2019-02-12 DIAGNOSIS — R490 Dysphonia: Secondary | ICD-10-CM | POA: Diagnosis not present

## 2019-02-13 DIAGNOSIS — G8929 Other chronic pain: Secondary | ICD-10-CM | POA: Diagnosis not present

## 2019-02-13 DIAGNOSIS — Z79899 Other long term (current) drug therapy: Secondary | ICD-10-CM | POA: Diagnosis not present

## 2019-02-13 DIAGNOSIS — F17210 Nicotine dependence, cigarettes, uncomplicated: Secondary | ICD-10-CM | POA: Diagnosis not present

## 2019-02-13 DIAGNOSIS — Z1159 Encounter for screening for other viral diseases: Secondary | ICD-10-CM | POA: Diagnosis not present

## 2019-02-13 DIAGNOSIS — M545 Low back pain: Secondary | ICD-10-CM | POA: Diagnosis not present

## 2019-03-18 DIAGNOSIS — Z1331 Encounter for screening for depression: Secondary | ICD-10-CM | POA: Diagnosis not present

## 2019-03-18 DIAGNOSIS — E559 Vitamin D deficiency, unspecified: Secondary | ICD-10-CM | POA: Diagnosis not present

## 2019-03-18 DIAGNOSIS — R635 Abnormal weight gain: Secondary | ICD-10-CM | POA: Diagnosis not present

## 2019-03-18 DIAGNOSIS — M545 Low back pain: Secondary | ICD-10-CM | POA: Diagnosis not present

## 2019-03-18 DIAGNOSIS — M129 Arthropathy, unspecified: Secondary | ICD-10-CM | POA: Diagnosis not present

## 2019-03-18 DIAGNOSIS — Z79899 Other long term (current) drug therapy: Secondary | ICD-10-CM | POA: Diagnosis not present

## 2019-03-18 DIAGNOSIS — Z0000 Encounter for general adult medical examination without abnormal findings: Secondary | ICD-10-CM | POA: Diagnosis not present

## 2019-03-18 DIAGNOSIS — Z113 Encounter for screening for infections with a predominantly sexual mode of transmission: Secondary | ICD-10-CM | POA: Diagnosis not present

## 2019-03-18 DIAGNOSIS — R0602 Shortness of breath: Secondary | ICD-10-CM | POA: Diagnosis not present

## 2019-03-18 DIAGNOSIS — Z1159 Encounter for screening for other viral diseases: Secondary | ICD-10-CM | POA: Diagnosis not present

## 2019-08-20 DIAGNOSIS — G8929 Other chronic pain: Secondary | ICD-10-CM | POA: Diagnosis not present

## 2019-08-20 DIAGNOSIS — M545 Low back pain: Secondary | ICD-10-CM | POA: Diagnosis not present

## 2019-08-20 DIAGNOSIS — F17210 Nicotine dependence, cigarettes, uncomplicated: Secondary | ICD-10-CM | POA: Diagnosis not present

## 2019-08-20 DIAGNOSIS — Z03818 Encounter for observation for suspected exposure to other biological agents ruled out: Secondary | ICD-10-CM | POA: Diagnosis not present

## 2019-08-20 DIAGNOSIS — Z79899 Other long term (current) drug therapy: Secondary | ICD-10-CM | POA: Diagnosis not present

## 2019-08-20 DIAGNOSIS — R0602 Shortness of breath: Secondary | ICD-10-CM | POA: Diagnosis not present

## 2019-08-31 DIAGNOSIS — R490 Dysphonia: Secondary | ICD-10-CM | POA: Diagnosis not present

## 2019-08-31 DIAGNOSIS — J383 Other diseases of vocal cords: Secondary | ICD-10-CM | POA: Diagnosis not present

## 2019-09-17 DIAGNOSIS — G8929 Other chronic pain: Secondary | ICD-10-CM | POA: Diagnosis not present

## 2019-09-17 DIAGNOSIS — Z79899 Other long term (current) drug therapy: Secondary | ICD-10-CM | POA: Diagnosis not present

## 2019-09-17 DIAGNOSIS — R05 Cough: Secondary | ICD-10-CM | POA: Diagnosis not present

## 2019-09-17 DIAGNOSIS — J018 Other acute sinusitis: Secondary | ICD-10-CM | POA: Diagnosis not present

## 2019-09-17 DIAGNOSIS — Z03818 Encounter for observation for suspected exposure to other biological agents ruled out: Secondary | ICD-10-CM | POA: Diagnosis not present

## 2019-09-17 DIAGNOSIS — R0602 Shortness of breath: Secondary | ICD-10-CM | POA: Diagnosis not present

## 2019-09-17 DIAGNOSIS — F17210 Nicotine dependence, cigarettes, uncomplicated: Secondary | ICD-10-CM | POA: Diagnosis not present

## 2019-09-17 DIAGNOSIS — M545 Low back pain: Secondary | ICD-10-CM | POA: Diagnosis not present

## 2019-10-20 DIAGNOSIS — E611 Iron deficiency: Secondary | ICD-10-CM | POA: Diagnosis not present

## 2019-10-20 DIAGNOSIS — M545 Low back pain: Secondary | ICD-10-CM | POA: Diagnosis not present

## 2019-10-20 DIAGNOSIS — F17210 Nicotine dependence, cigarettes, uncomplicated: Secondary | ICD-10-CM | POA: Diagnosis not present

## 2019-10-20 DIAGNOSIS — E7800 Pure hypercholesterolemia, unspecified: Secondary | ICD-10-CM | POA: Diagnosis not present

## 2019-10-20 DIAGNOSIS — E559 Vitamin D deficiency, unspecified: Secondary | ICD-10-CM | POA: Diagnosis not present

## 2019-10-20 DIAGNOSIS — Z79899 Other long term (current) drug therapy: Secondary | ICD-10-CM | POA: Diagnosis not present

## 2019-10-20 DIAGNOSIS — R0602 Shortness of breath: Secondary | ICD-10-CM | POA: Diagnosis not present

## 2019-11-18 DIAGNOSIS — Z20822 Contact with and (suspected) exposure to covid-19: Secondary | ICD-10-CM | POA: Diagnosis not present

## 2019-11-18 DIAGNOSIS — G8929 Other chronic pain: Secondary | ICD-10-CM | POA: Diagnosis not present

## 2019-11-18 DIAGNOSIS — Z79899 Other long term (current) drug therapy: Secondary | ICD-10-CM | POA: Diagnosis not present

## 2019-11-18 DIAGNOSIS — R0602 Shortness of breath: Secondary | ICD-10-CM | POA: Diagnosis not present

## 2019-11-18 DIAGNOSIS — M545 Low back pain: Secondary | ICD-10-CM | POA: Diagnosis not present

## 2019-11-18 DIAGNOSIS — F17210 Nicotine dependence, cigarettes, uncomplicated: Secondary | ICD-10-CM | POA: Diagnosis not present

## 2019-12-02 DIAGNOSIS — E039 Hypothyroidism, unspecified: Secondary | ICD-10-CM | POA: Diagnosis not present

## 2019-12-02 DIAGNOSIS — Z85850 Personal history of malignant neoplasm of thyroid: Secondary | ICD-10-CM | POA: Diagnosis not present

## 2019-12-02 DIAGNOSIS — Z79899 Other long term (current) drug therapy: Secondary | ICD-10-CM | POA: Diagnosis not present

## 2019-12-21 DIAGNOSIS — R0602 Shortness of breath: Secondary | ICD-10-CM | POA: Diagnosis not present

## 2019-12-21 DIAGNOSIS — F17210 Nicotine dependence, cigarettes, uncomplicated: Secondary | ICD-10-CM | POA: Diagnosis not present

## 2019-12-21 DIAGNOSIS — Z1159 Encounter for screening for other viral diseases: Secondary | ICD-10-CM | POA: Diagnosis not present

## 2019-12-21 DIAGNOSIS — G8929 Other chronic pain: Secondary | ICD-10-CM | POA: Diagnosis not present

## 2019-12-21 DIAGNOSIS — M545 Low back pain: Secondary | ICD-10-CM | POA: Diagnosis not present

## 2019-12-21 DIAGNOSIS — Z79899 Other long term (current) drug therapy: Secondary | ICD-10-CM | POA: Diagnosis not present

## 2020-01-29 DIAGNOSIS — E559 Vitamin D deficiency, unspecified: Secondary | ICD-10-CM | POA: Diagnosis not present

## 2020-01-29 DIAGNOSIS — Z13228 Encounter for screening for other metabolic disorders: Secondary | ICD-10-CM | POA: Diagnosis not present

## 2020-01-29 DIAGNOSIS — Z13220 Encounter for screening for lipoid disorders: Secondary | ICD-10-CM | POA: Diagnosis not present

## 2020-01-29 DIAGNOSIS — M129 Arthropathy, unspecified: Secondary | ICD-10-CM | POA: Diagnosis not present

## 2020-01-29 DIAGNOSIS — Z131 Encounter for screening for diabetes mellitus: Secondary | ICD-10-CM | POA: Diagnosis not present

## 2020-01-29 DIAGNOSIS — Z0000 Encounter for general adult medical examination without abnormal findings: Secondary | ICD-10-CM | POA: Diagnosis not present

## 2020-01-29 DIAGNOSIS — Z79899 Other long term (current) drug therapy: Secondary | ICD-10-CM | POA: Diagnosis not present

## 2020-01-29 DIAGNOSIS — F17210 Nicotine dependence, cigarettes, uncomplicated: Secondary | ICD-10-CM | POA: Diagnosis not present

## 2020-01-29 DIAGNOSIS — E039 Hypothyroidism, unspecified: Secondary | ICD-10-CM | POA: Diagnosis not present

## 2020-01-29 DIAGNOSIS — R0602 Shortness of breath: Secondary | ICD-10-CM | POA: Diagnosis not present

## 2020-01-29 DIAGNOSIS — M545 Low back pain, unspecified: Secondary | ICD-10-CM | POA: Diagnosis not present

## 2020-01-29 DIAGNOSIS — Z20822 Contact with and (suspected) exposure to covid-19: Secondary | ICD-10-CM | POA: Diagnosis not present

## 2020-01-29 DIAGNOSIS — G8929 Other chronic pain: Secondary | ICD-10-CM | POA: Diagnosis not present

## 2020-01-29 DIAGNOSIS — D539 Nutritional anemia, unspecified: Secondary | ICD-10-CM | POA: Diagnosis not present

## 2020-03-02 DIAGNOSIS — G8929 Other chronic pain: Secondary | ICD-10-CM | POA: Diagnosis not present

## 2020-03-02 DIAGNOSIS — G43909 Migraine, unspecified, not intractable, without status migrainosus: Secondary | ICD-10-CM | POA: Diagnosis not present

## 2020-03-02 DIAGNOSIS — M545 Low back pain, unspecified: Secondary | ICD-10-CM | POA: Diagnosis not present

## 2020-03-02 DIAGNOSIS — Z79899 Other long term (current) drug therapy: Secondary | ICD-10-CM | POA: Diagnosis not present

## 2020-03-29 DIAGNOSIS — E038 Other specified hypothyroidism: Secondary | ICD-10-CM | POA: Diagnosis not present

## 2020-03-29 DIAGNOSIS — M549 Dorsalgia, unspecified: Secondary | ICD-10-CM | POA: Diagnosis not present

## 2020-03-30 DIAGNOSIS — R059 Cough, unspecified: Secondary | ICD-10-CM | POA: Diagnosis not present

## 2020-03-30 DIAGNOSIS — J018 Other acute sinusitis: Secondary | ICD-10-CM | POA: Diagnosis not present

## 2020-03-30 DIAGNOSIS — R0602 Shortness of breath: Secondary | ICD-10-CM | POA: Diagnosis not present

## 2020-03-30 DIAGNOSIS — Z79899 Other long term (current) drug therapy: Secondary | ICD-10-CM | POA: Diagnosis not present

## 2020-03-30 DIAGNOSIS — Z20822 Contact with and (suspected) exposure to covid-19: Secondary | ICD-10-CM | POA: Diagnosis not present

## 2020-03-30 DIAGNOSIS — G43909 Migraine, unspecified, not intractable, without status migrainosus: Secondary | ICD-10-CM | POA: Diagnosis not present

## 2020-04-19 IMAGING — MG DIGITAL SCREENING BILATERAL MAMMOGRAM WITH TOMO AND CAD
6 of 10 series · 6 of 30 positions shown · non-contrast
Comparison: None.

ACR Breast Density Category a: The breast tissue is almost entirely
fatty.

CLINICAL DATA: Screening.

EXAM:
DIGITAL SCREENING BILATERAL MAMMOGRAM WITH TOMO AND CAD

[R CC synth-2D]
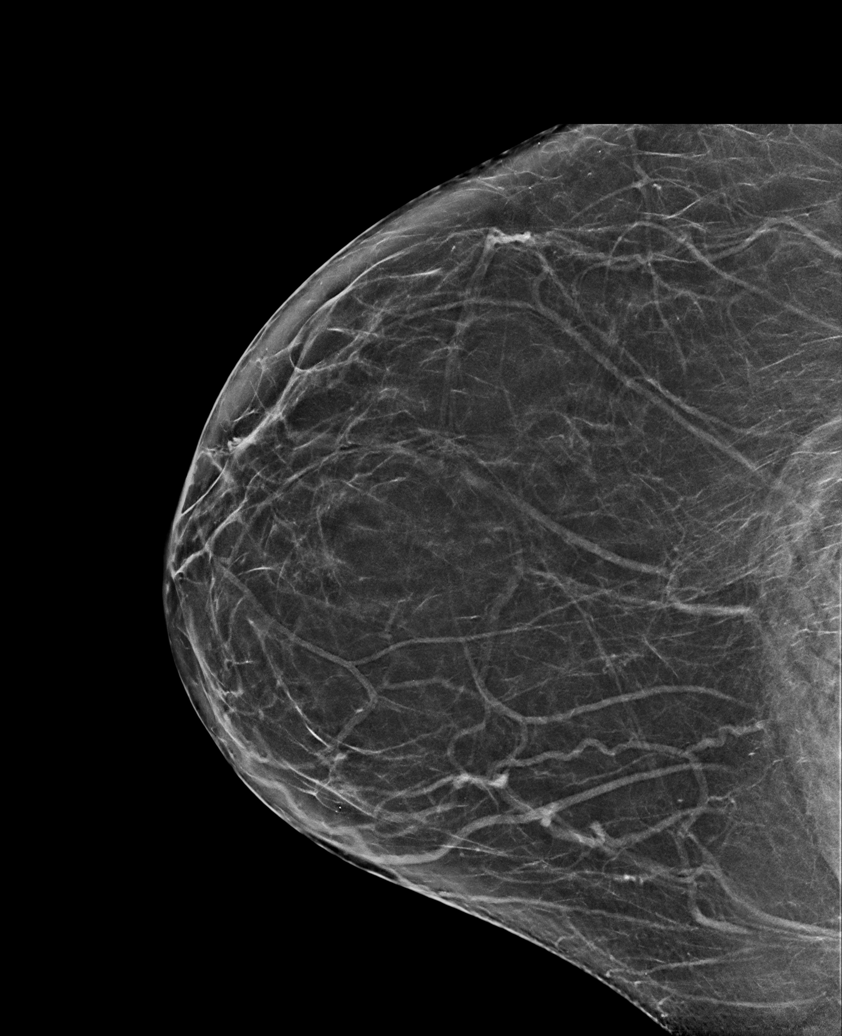

[R CV synth-2D]
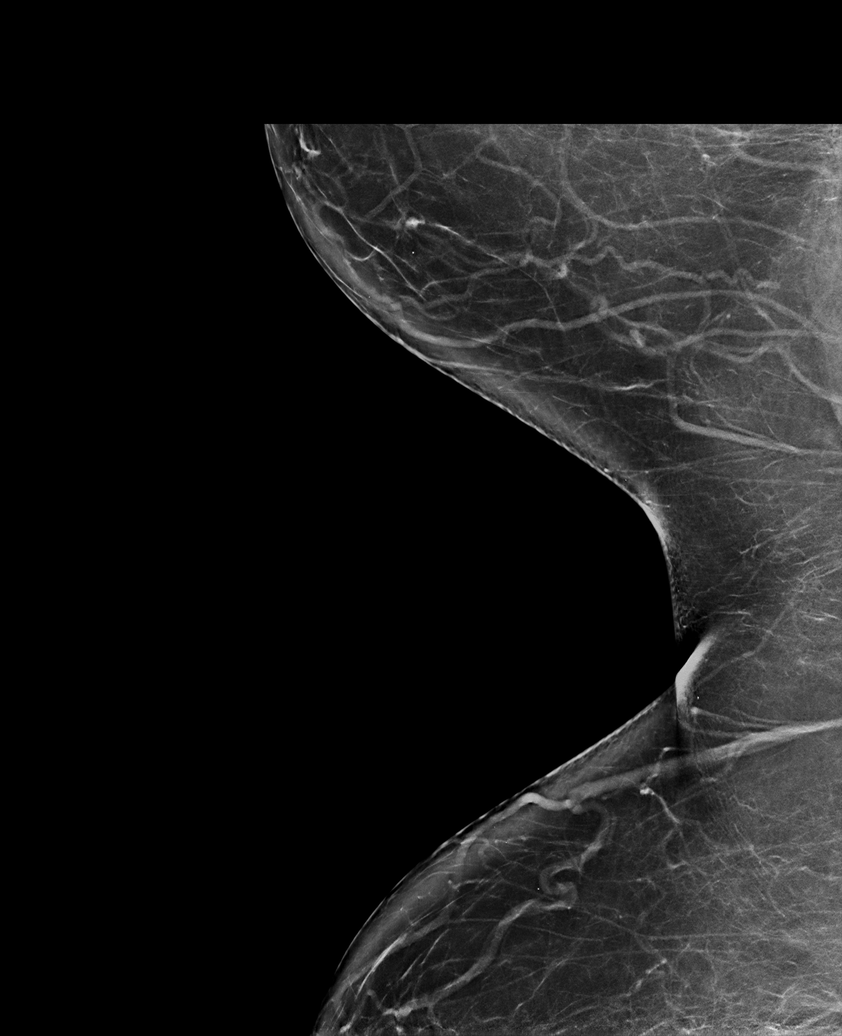

[L CC synth-2D]
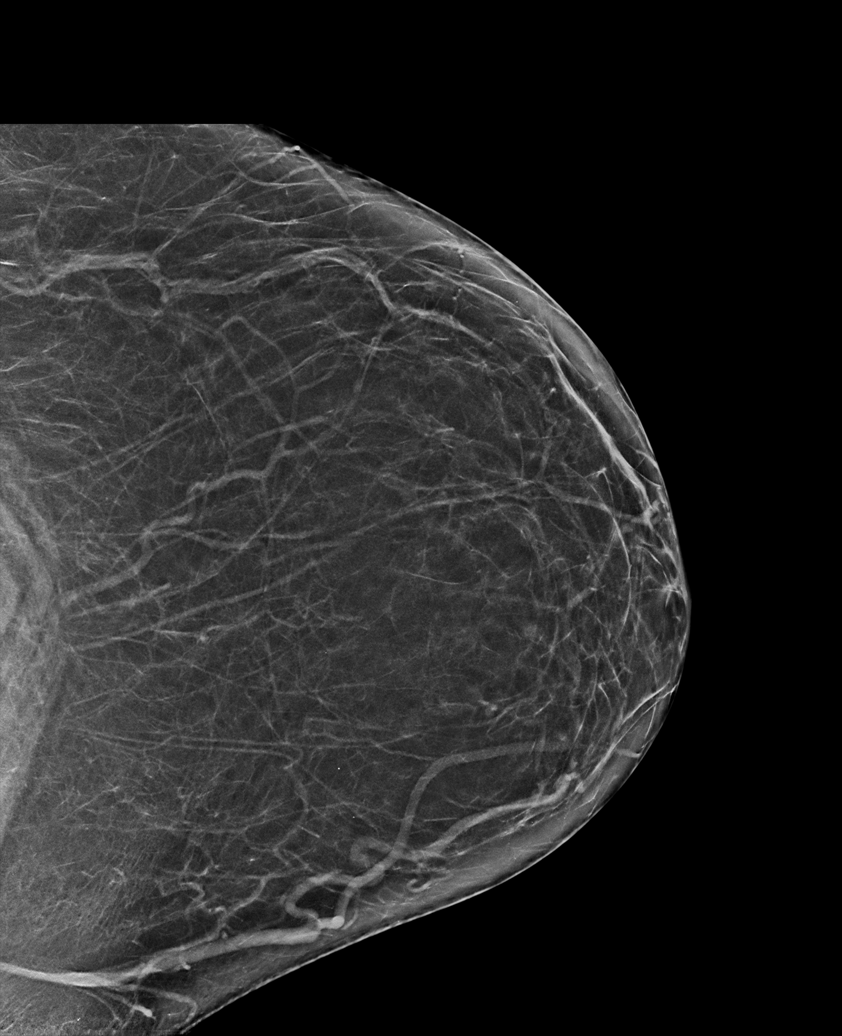

[L MLO synth-2D]
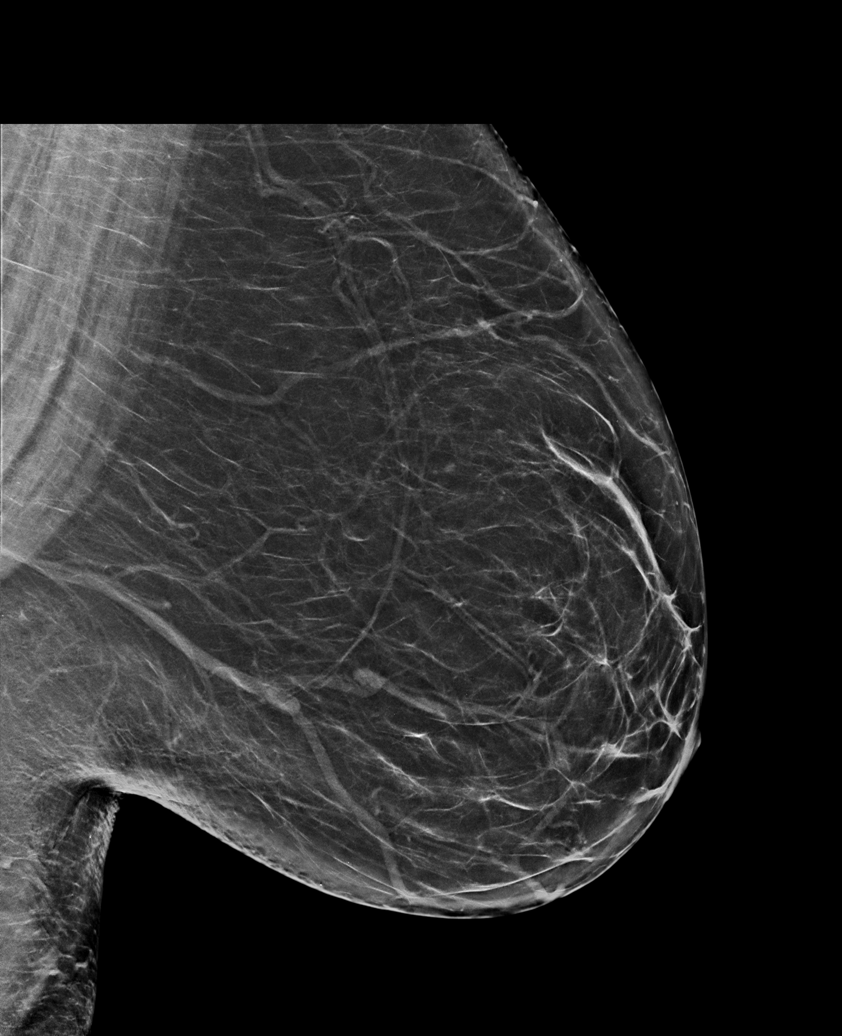

[R MLO synth-2D]
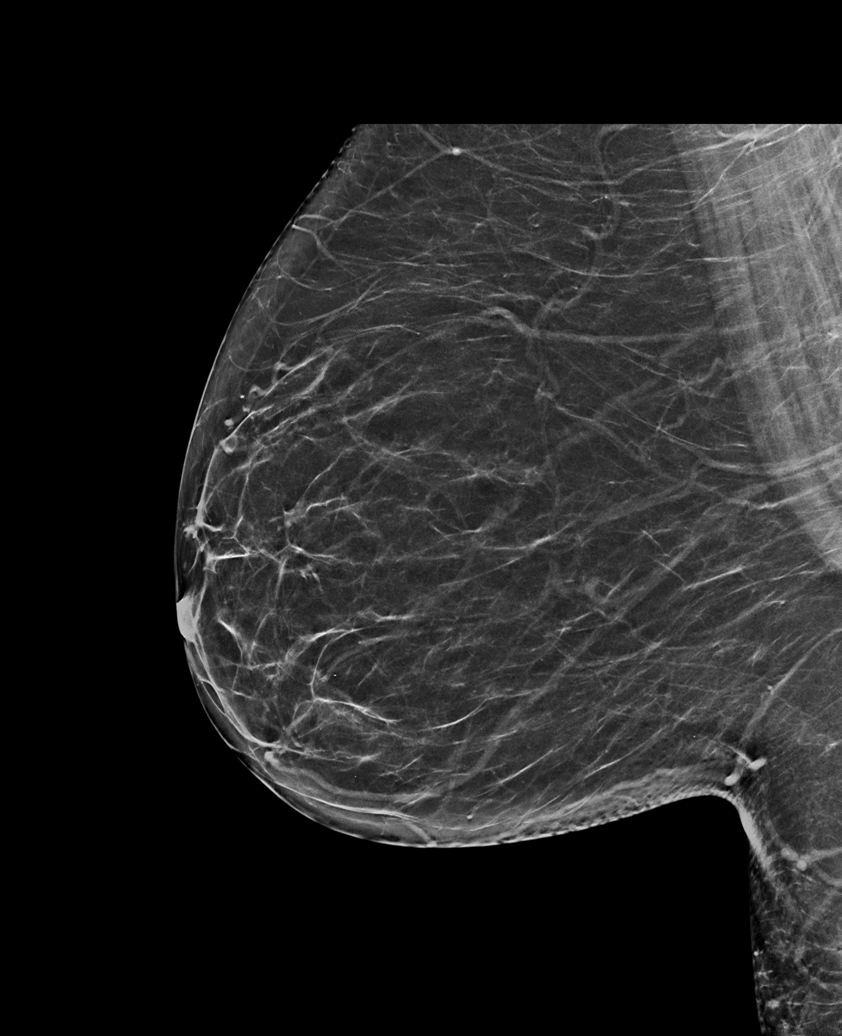

[R CV tomo · tomo slice 39/76.0]
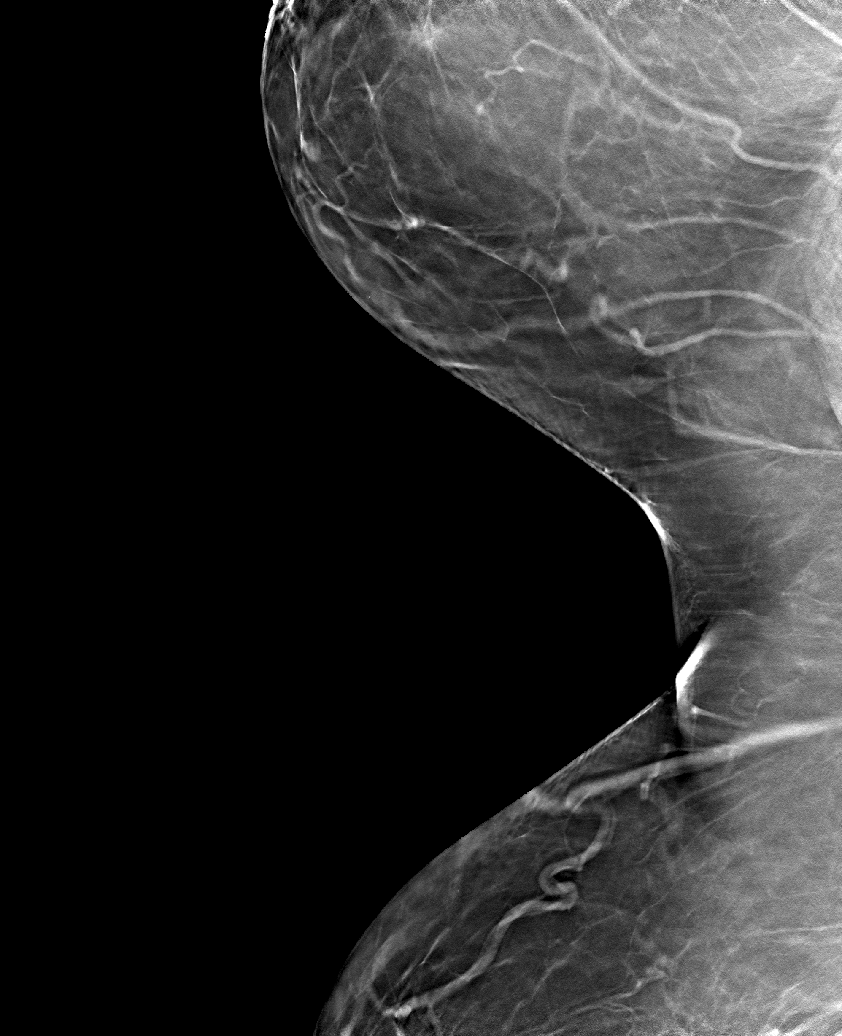

[6 of 30 positions shown; findings below may reference images not displayed]

FINDINGS: There are no findings suspicious for malignancy. Images were
processed with CAD.
IMPRESSION: No mammographic evidence of malignancy. A result letter of this
screening mammogram will be mailed directly to the patient.

RECOMMENDATION:
Screening mammogram in one year. (Code:0P-[id number])

BI-RADS CATEGORY  1: Negative.

## 2020-04-27 DIAGNOSIS — F17210 Nicotine dependence, cigarettes, uncomplicated: Secondary | ICD-10-CM | POA: Diagnosis not present

## 2020-04-27 DIAGNOSIS — Z1159 Encounter for screening for other viral diseases: Secondary | ICD-10-CM | POA: Diagnosis not present

## 2020-04-27 DIAGNOSIS — M545 Low back pain, unspecified: Secondary | ICD-10-CM | POA: Diagnosis not present

## 2020-04-27 DIAGNOSIS — R0602 Shortness of breath: Secondary | ICD-10-CM | POA: Diagnosis not present

## 2020-04-27 DIAGNOSIS — Z79899 Other long term (current) drug therapy: Secondary | ICD-10-CM | POA: Diagnosis not present

## 2020-04-27 DIAGNOSIS — G8929 Other chronic pain: Secondary | ICD-10-CM | POA: Diagnosis not present

## 2020-04-28 DIAGNOSIS — E039 Hypothyroidism, unspecified: Secondary | ICD-10-CM | POA: Diagnosis not present

## 2020-04-28 DIAGNOSIS — M549 Dorsalgia, unspecified: Secondary | ICD-10-CM | POA: Diagnosis not present

## 2020-05-30 DIAGNOSIS — R0602 Shortness of breath: Secondary | ICD-10-CM | POA: Diagnosis not present

## 2020-05-30 DIAGNOSIS — Z20822 Contact with and (suspected) exposure to covid-19: Secondary | ICD-10-CM | POA: Diagnosis not present

## 2020-05-30 DIAGNOSIS — F17210 Nicotine dependence, cigarettes, uncomplicated: Secondary | ICD-10-CM | POA: Diagnosis not present

## 2020-05-30 DIAGNOSIS — G8929 Other chronic pain: Secondary | ICD-10-CM | POA: Diagnosis not present

## 2020-05-30 DIAGNOSIS — M545 Low back pain, unspecified: Secondary | ICD-10-CM | POA: Diagnosis not present

## 2020-05-30 DIAGNOSIS — Z79899 Other long term (current) drug therapy: Secondary | ICD-10-CM | POA: Diagnosis not present

## 2020-05-30 DIAGNOSIS — Z1159 Encounter for screening for other viral diseases: Secondary | ICD-10-CM | POA: Diagnosis not present

## 2020-06-27 DIAGNOSIS — M545 Low back pain, unspecified: Secondary | ICD-10-CM | POA: Diagnosis not present

## 2020-06-27 DIAGNOSIS — E038 Other specified hypothyroidism: Secondary | ICD-10-CM | POA: Diagnosis not present

## 2020-06-27 DIAGNOSIS — F17210 Nicotine dependence, cigarettes, uncomplicated: Secondary | ICD-10-CM | POA: Diagnosis not present

## 2020-06-27 DIAGNOSIS — Z79899 Other long term (current) drug therapy: Secondary | ICD-10-CM | POA: Diagnosis not present

## 2020-06-27 DIAGNOSIS — M549 Dorsalgia, unspecified: Secondary | ICD-10-CM | POA: Diagnosis not present

## 2020-06-27 DIAGNOSIS — G8929 Other chronic pain: Secondary | ICD-10-CM | POA: Diagnosis not present

## 2020-07-25 DIAGNOSIS — E039 Hypothyroidism, unspecified: Secondary | ICD-10-CM | POA: Diagnosis not present

## 2020-07-25 DIAGNOSIS — G8929 Other chronic pain: Secondary | ICD-10-CM | POA: Diagnosis not present

## 2020-07-25 DIAGNOSIS — E7800 Pure hypercholesterolemia, unspecified: Secondary | ICD-10-CM | POA: Diagnosis not present

## 2020-07-25 DIAGNOSIS — M129 Arthropathy, unspecified: Secondary | ICD-10-CM | POA: Diagnosis not present

## 2020-07-25 DIAGNOSIS — Z20822 Contact with and (suspected) exposure to covid-19: Secondary | ICD-10-CM | POA: Diagnosis not present

## 2020-07-25 DIAGNOSIS — D539 Nutritional anemia, unspecified: Secondary | ICD-10-CM | POA: Diagnosis not present

## 2020-07-25 DIAGNOSIS — Z79899 Other long term (current) drug therapy: Secondary | ICD-10-CM | POA: Diagnosis not present

## 2020-07-25 DIAGNOSIS — E559 Vitamin D deficiency, unspecified: Secondary | ICD-10-CM | POA: Diagnosis not present

## 2020-07-25 DIAGNOSIS — M545 Low back pain, unspecified: Secondary | ICD-10-CM | POA: Diagnosis not present

## 2020-07-27 DIAGNOSIS — M549 Dorsalgia, unspecified: Secondary | ICD-10-CM | POA: Diagnosis not present

## 2020-07-27 DIAGNOSIS — E039 Hypothyroidism, unspecified: Secondary | ICD-10-CM | POA: Diagnosis not present

## 2020-08-19 DIAGNOSIS — E038 Other specified hypothyroidism: Secondary | ICD-10-CM | POA: Diagnosis not present

## 2020-08-23 DIAGNOSIS — G8929 Other chronic pain: Secondary | ICD-10-CM | POA: Diagnosis not present

## 2020-08-23 DIAGNOSIS — Z79899 Other long term (current) drug therapy: Secondary | ICD-10-CM | POA: Diagnosis not present

## 2020-08-23 DIAGNOSIS — F32A Depression, unspecified: Secondary | ICD-10-CM | POA: Diagnosis not present

## 2020-08-23 DIAGNOSIS — M545 Low back pain, unspecified: Secondary | ICD-10-CM | POA: Diagnosis not present

## 2020-08-28 ENCOUNTER — Ambulatory Visit (INDEPENDENT_AMBULATORY_CARE_PROVIDER_SITE_OTHER): Payer: Medicare Other

## 2020-08-28 ENCOUNTER — Other Ambulatory Visit: Payer: Self-pay

## 2020-08-28 ENCOUNTER — Ambulatory Visit
Admission: EM | Admit: 2020-08-28 | Discharge: 2020-08-28 | Disposition: A | Discharge status: Home / Self Care | Payer: Medicare Other | Attending: Emergency Medicine | Admitting: Emergency Medicine

## 2020-08-28 ENCOUNTER — Encounter: Payer: Self-pay | Admitting: Emergency Medicine

## 2020-08-28 DIAGNOSIS — M25572 Pain in left ankle and joints of left foot: Secondary | ICD-10-CM | POA: Diagnosis not present

## 2020-08-28 DIAGNOSIS — M7989 Other specified soft tissue disorders: Secondary | ICD-10-CM | POA: Diagnosis not present

## 2020-08-28 DIAGNOSIS — S93402A Sprain of unspecified ligament of left ankle, initial encounter: Secondary | ICD-10-CM

## 2020-08-28 NOTE — Discharge Instructions (Signed)
X-ray normal Continue with Mobic daily Ice and elevate Wear ankle brace Follow-up with sports medicine if persisting

## 2020-08-28 NOTE — ED Triage Notes (Signed)
Pt sts left ankle pain she noticed while helping transfer her boyfriend who had a stroke; pt sts now has swollen area where there is pain

## 2020-08-29 NOTE — ED Provider Notes (Signed)
[facility]    CSN: [id number] Arrival date & time: 08/28/20  1431      History   Chief Complaint Chief Complaint  Patient presents with  . Ankle Pain    HPI Ann Perez is a 45 y.o. female presenting today for evaluation of ankle pain.  Patient reports over the past few weeks she has had pain in her left ankle.  Denies specific injury or trauma, but does report that she has been helping her boyfriend who is 300 pounds and had a stroke transfer between bed and wheelchair.  She reports that she initially had cramping to the base of her foot, but now has an area of swelling and discomfort around her inner ankle.  HPI  Past Medical History:  Diagnosis Date  . Hernia     There are no problems to display for this patient.   Past Surgical History:  Procedure Laterality Date  . CESAREAN SECTION    . CHOLECYSTECTOMY    . HERNIA REPAIR  2004  . paniculectomy  06/2009    OB History   No obstetric history on file.      Home Medications    Prior to Admission medications   Medication Sig Start Date End Date Taking? Authorizing Provider  cetirizine (ZYRTEC) 10 MG tablet Take 10 mg by mouth daily.    [provider]  clonazePAM (KLONOPIN) 0.5 MG tablet Take 1 mg by mouth 3 (three) times daily as needed.    [provider]  EPINEPHrine (EPIPEN) 0.3 mg/0.3 mL DEVI Inject 0.3 mg into the muscle once.    [provider]  FLUoxetine (PROZAC) 40 MG capsule Take 40 mg by mouth daily.    [provider]  HYDROcodone-acetaminophen (NORCO) 5-325 MG per tablet Take 1 tablet by mouth every 6 (six) hours as needed.    [provider]    Family History Family History  Problem Relation Age of Onset  . Cancer Maternal Grandmother        breast  . Cancer Maternal Grandfather   . Cancer Paternal Grandmother        brain  . Cancer Cousin        ovarian    Social History Social History   Tobacco Use  . Smoking status:  Current Every Day Smoker    Packs/day: 1.00    Types: Cigarettes  Substance Use Topics  . Alcohol use: No  . Drug use: No     Allergies   Tape   Review of Systems Review of Systems  Constitutional: Negative for fatigue and fever.  Eyes: Negative for visual disturbance.  Respiratory: Negative for shortness of breath.   Cardiovascular: Negative for chest pain.  Gastrointestinal: Negative for abdominal pain, nausea and vomiting.  Musculoskeletal: Positive for arthralgias, gait problem and joint swelling.  Skin: Negative for color change, rash and wound.  Neurological: Negative for dizziness, weakness, light-headedness and headaches.     Physical Exam Triage Vital Signs ED Triage Vitals [08/28/20 1553]  Enc Vitals Group     BP 135/84     Pulse Rate 77     Resp 18     Temp 98.1 F (36.7 C)     Temp Source Oral     SpO2 97 %     Weight      Height      Head Circumference      Peak Flow      Pain Score 5     Pain  Loc      Pain Edu?      Excl. in [place]?    No data found.  Updated Vital Signs BP 135/84 (BP Location: Right Arm)   Pulse 77   Temp 98.1 F (36.7 C) (Oral)   Resp 18   SpO2 97%   Visual Acuity Right Eye Distance:   Left Eye Distance:   Bilateral Distance:    Right Eye Near:   Left Eye Near:    Bilateral Near:     Physical Exam   UC Treatments / Results  Labs (all labs ordered are listed, but only abnormal results are displayed) Labs Reviewed - No data to display  EKG   Radiology DG Ankle Complete Left  Result Date: 08/28/2020 CLINICAL DATA:  Ankle pain. EXAM: LEFT ANKLE COMPLETE - 3+ VIEW COMPARISON:  None. FINDINGS: There is soft tissue swelling about the lateral malleolus. There is no definite acute displaced fracture or dislocation. There is an os trigonum. There is a small plantar calcaneal spur. There are mild degenerative changes of the ankle mortise and midfoot. IMPRESSION: Soft tissue swelling without evidence for an acute displaced  fracture or dislocation. Electronically Signed   By: Constance Holster M.D.   On: 08/28/2020 15:47    Procedures Procedures (including critical care time)  Medications Ordered in UC Medications - No data to display  Initial Impression / Assessment and Plan / UC Course  I have reviewed the triage vital signs and the nursing notes.  Pertinent labs & imaging results that were available during my care of the patient were reviewed by me and considered in my medical decision making (see chart for details).     Ankle sprain-x-ray without acute bony abnormality, recommending to continue to wear ankle brace continue NSAIDs ice elevate and rest as much as possible, follow-up with sports medicine if persisting.  Discussed strict return precautions. Patient verbalized understanding and is agreeable with plan.  Final Clinical Impressions(s) / UC Diagnoses   Final diagnoses:  Sprain of left ankle, unspecified ligament, initial encounter     Discharge Instructions     X-ray normal Continue with Mobic daily Ice and elevate Wear ankle brace Follow-up with sports medicine if persisting    ED Prescriptions    None     PDMP not reviewed this encounter.   Janith Lima, [place] 08/29/20 [phone]

## 2020-09-23 DIAGNOSIS — G8929 Other chronic pain: Secondary | ICD-10-CM | POA: Diagnosis not present

## 2020-09-23 DIAGNOSIS — M545 Low back pain, unspecified: Secondary | ICD-10-CM | POA: Diagnosis not present

## 2020-09-23 DIAGNOSIS — Z79899 Other long term (current) drug therapy: Secondary | ICD-10-CM | POA: Diagnosis not present

## 2020-09-23 DIAGNOSIS — Z20822 Contact with and (suspected) exposure to covid-19: Secondary | ICD-10-CM | POA: Diagnosis not present

## 2020-09-23 DIAGNOSIS — R030 Elevated blood-pressure reading, without diagnosis of hypertension: Secondary | ICD-10-CM | POA: Diagnosis not present

## 2020-10-21 DIAGNOSIS — Z79899 Other long term (current) drug therapy: Secondary | ICD-10-CM | POA: Diagnosis not present

## 2020-10-21 DIAGNOSIS — M542 Cervicalgia: Secondary | ICD-10-CM | POA: Diagnosis not present

## 2020-10-21 DIAGNOSIS — M545 Low back pain, unspecified: Secondary | ICD-10-CM | POA: Diagnosis not present

## 2020-10-21 DIAGNOSIS — G8929 Other chronic pain: Secondary | ICD-10-CM | POA: Diagnosis not present

## 2020-10-21 DIAGNOSIS — F17210 Nicotine dependence, cigarettes, uncomplicated: Secondary | ICD-10-CM | POA: Diagnosis not present

## 2020-10-21 DIAGNOSIS — Z20822 Contact with and (suspected) exposure to covid-19: Secondary | ICD-10-CM | POA: Diagnosis not present

## 2020-10-21 DIAGNOSIS — R0602 Shortness of breath: Secondary | ICD-10-CM | POA: Diagnosis not present

## 2020-10-21 DIAGNOSIS — J018 Other acute sinusitis: Secondary | ICD-10-CM | POA: Diagnosis not present

## 2020-10-25 DIAGNOSIS — Z79899 Other long term (current) drug therapy: Secondary | ICD-10-CM | POA: Diagnosis not present

## 2020-11-23 DIAGNOSIS — G039 Meningitis, unspecified: Secondary | ICD-10-CM | POA: Diagnosis not present

## 2020-11-23 DIAGNOSIS — M5134 Other intervertebral disc degeneration, thoracic region: Secondary | ICD-10-CM | POA: Diagnosis not present

## 2020-11-23 DIAGNOSIS — Z79899 Other long term (current) drug therapy: Secondary | ICD-10-CM | POA: Diagnosis not present

## 2020-11-23 DIAGNOSIS — E039 Hypothyroidism, unspecified: Secondary | ICD-10-CM | POA: Diagnosis not present

## 2020-11-23 DIAGNOSIS — M419 Scoliosis, unspecified: Secondary | ICD-10-CM | POA: Diagnosis not present

## 2020-11-23 DIAGNOSIS — Z1159 Encounter for screening for other viral diseases: Secondary | ICD-10-CM | POA: Diagnosis not present

## 2020-11-23 DIAGNOSIS — M5124 Other intervertebral disc displacement, thoracic region: Secondary | ICD-10-CM | POA: Diagnosis not present

## 2020-11-23 DIAGNOSIS — M129 Arthropathy, unspecified: Secondary | ICD-10-CM | POA: Diagnosis not present

## 2020-11-23 DIAGNOSIS — M545 Low back pain, unspecified: Secondary | ICD-10-CM | POA: Diagnosis not present

## 2020-11-23 DIAGNOSIS — D539 Nutritional anemia, unspecified: Secondary | ICD-10-CM | POA: Diagnosis not present

## 2020-11-23 DIAGNOSIS — G8929 Other chronic pain: Secondary | ICD-10-CM | POA: Diagnosis not present

## 2020-11-23 DIAGNOSIS — E7800 Pure hypercholesterolemia, unspecified: Secondary | ICD-10-CM | POA: Diagnosis not present

## 2020-11-23 DIAGNOSIS — E559 Vitamin D deficiency, unspecified: Secondary | ICD-10-CM | POA: Diagnosis not present

## 2020-11-23 DIAGNOSIS — S299XXA Unspecified injury of thorax, initial encounter: Secondary | ICD-10-CM | POA: Diagnosis not present

## 2020-11-23 DIAGNOSIS — R519 Headache, unspecified: Secondary | ICD-10-CM | POA: Diagnosis not present

## 2020-11-24 DIAGNOSIS — S299XXA Unspecified injury of thorax, initial encounter: Secondary | ICD-10-CM | POA: Diagnosis not present

## 2020-11-24 DIAGNOSIS — M545 Low back pain, unspecified: Secondary | ICD-10-CM | POA: Diagnosis not present

## 2020-11-24 DIAGNOSIS — M5134 Other intervertebral disc degeneration, thoracic region: Secondary | ICD-10-CM | POA: Diagnosis not present

## 2020-11-24 DIAGNOSIS — G8929 Other chronic pain: Secondary | ICD-10-CM | POA: Diagnosis not present

## 2020-11-24 DIAGNOSIS — G039 Meningitis, unspecified: Secondary | ICD-10-CM | POA: Diagnosis not present

## 2020-11-24 DIAGNOSIS — Z85850 Personal history of malignant neoplasm of thyroid: Secondary | ICD-10-CM | POA: Diagnosis not present

## 2020-11-24 DIAGNOSIS — F17210 Nicotine dependence, cigarettes, uncomplicated: Secondary | ICD-10-CM | POA: Diagnosis not present

## 2020-11-24 DIAGNOSIS — Z91030 Bee allergy status: Secondary | ICD-10-CM | POA: Diagnosis not present

## 2020-11-24 DIAGNOSIS — G629 Polyneuropathy, unspecified: Secondary | ICD-10-CM | POA: Diagnosis not present

## 2020-11-24 DIAGNOSIS — R519 Headache, unspecified: Secondary | ICD-10-CM | POA: Diagnosis not present

## 2020-11-24 DIAGNOSIS — M1990 Unspecified osteoarthritis, unspecified site: Secondary | ICD-10-CM | POA: Diagnosis not present

## 2020-11-24 DIAGNOSIS — K219 Gastro-esophageal reflux disease without esophagitis: Secondary | ICD-10-CM | POA: Diagnosis not present

## 2020-11-24 DIAGNOSIS — G030 Nonpyogenic meningitis: Secondary | ICD-10-CM | POA: Diagnosis not present

## 2020-11-24 DIAGNOSIS — E039 Hypothyroidism, unspecified: Secondary | ICD-10-CM | POA: Diagnosis not present

## 2020-11-24 DIAGNOSIS — M5124 Other intervertebral disc displacement, thoracic region: Secondary | ICD-10-CM | POA: Diagnosis not present

## 2020-11-24 DIAGNOSIS — Z79899 Other long term (current) drug therapy: Secondary | ICD-10-CM | POA: Diagnosis not present

## 2020-11-24 DIAGNOSIS — Z888 Allergy status to other drugs, medicaments and biological substances status: Secondary | ICD-10-CM | POA: Diagnosis not present

## 2020-11-24 DIAGNOSIS — M549 Dorsalgia, unspecified: Secondary | ICD-10-CM | POA: Diagnosis not present

## 2020-11-24 DIAGNOSIS — M419 Scoliosis, unspecified: Secondary | ICD-10-CM | POA: Diagnosis not present

## 2020-11-24 DIAGNOSIS — F32A Depression, unspecified: Secondary | ICD-10-CM | POA: Diagnosis not present

## 2020-12-01 DIAGNOSIS — G030 Nonpyogenic meningitis: Secondary | ICD-10-CM | POA: Diagnosis not present

## 2020-12-20 DIAGNOSIS — G8929 Other chronic pain: Secondary | ICD-10-CM | POA: Diagnosis not present

## 2020-12-20 DIAGNOSIS — F17210 Nicotine dependence, cigarettes, uncomplicated: Secondary | ICD-10-CM | POA: Diagnosis not present

## 2020-12-20 DIAGNOSIS — F32A Depression, unspecified: Secondary | ICD-10-CM | POA: Diagnosis not present

## 2020-12-20 DIAGNOSIS — Z1159 Encounter for screening for other viral diseases: Secondary | ICD-10-CM | POA: Diagnosis not present

## 2020-12-20 DIAGNOSIS — Z79899 Other long term (current) drug therapy: Secondary | ICD-10-CM | POA: Diagnosis not present

## 2020-12-20 DIAGNOSIS — M545 Low back pain, unspecified: Secondary | ICD-10-CM | POA: Diagnosis not present

## 2020-12-21 DIAGNOSIS — Z79899 Other long term (current) drug therapy: Secondary | ICD-10-CM | POA: Diagnosis not present

## 2021-01-23 DIAGNOSIS — Z79899 Other long term (current) drug therapy: Secondary | ICD-10-CM | POA: Diagnosis not present

## 2021-01-23 DIAGNOSIS — Z1159 Encounter for screening for other viral diseases: Secondary | ICD-10-CM | POA: Diagnosis not present

## 2021-01-23 DIAGNOSIS — F32A Depression, unspecified: Secondary | ICD-10-CM | POA: Diagnosis not present

## 2021-01-23 DIAGNOSIS — F17210 Nicotine dependence, cigarettes, uncomplicated: Secondary | ICD-10-CM | POA: Diagnosis not present

## 2021-01-23 DIAGNOSIS — M545 Low back pain, unspecified: Secondary | ICD-10-CM | POA: Diagnosis not present

## 2021-01-23 DIAGNOSIS — G8929 Other chronic pain: Secondary | ICD-10-CM | POA: Diagnosis not present

## 2021-01-25 DIAGNOSIS — Z79899 Other long term (current) drug therapy: Secondary | ICD-10-CM | POA: Diagnosis not present

## 2021-02-20 DIAGNOSIS — Z131 Encounter for screening for diabetes mellitus: Secondary | ICD-10-CM | POA: Diagnosis not present

## 2021-02-20 DIAGNOSIS — E7800 Pure hypercholesterolemia, unspecified: Secondary | ICD-10-CM | POA: Diagnosis not present

## 2021-02-20 DIAGNOSIS — Z1159 Encounter for screening for other viral diseases: Secondary | ICD-10-CM | POA: Diagnosis not present

## 2021-02-20 DIAGNOSIS — E559 Vitamin D deficiency, unspecified: Secondary | ICD-10-CM | POA: Diagnosis not present

## 2021-02-20 DIAGNOSIS — R0602 Shortness of breath: Secondary | ICD-10-CM | POA: Diagnosis not present

## 2021-02-20 DIAGNOSIS — Z79899 Other long term (current) drug therapy: Secondary | ICD-10-CM | POA: Diagnosis not present

## 2021-02-20 DIAGNOSIS — Z0000 Encounter for general adult medical examination without abnormal findings: Secondary | ICD-10-CM | POA: Diagnosis not present

## 2021-02-20 DIAGNOSIS — E039 Hypothyroidism, unspecified: Secondary | ICD-10-CM | POA: Diagnosis not present

## 2021-02-20 DIAGNOSIS — M545 Low back pain, unspecified: Secondary | ICD-10-CM | POA: Diagnosis not present

## 2021-02-20 DIAGNOSIS — G8929 Other chronic pain: Secondary | ICD-10-CM | POA: Diagnosis not present

## 2021-02-20 DIAGNOSIS — F17210 Nicotine dependence, cigarettes, uncomplicated: Secondary | ICD-10-CM | POA: Diagnosis not present

## 2021-02-20 DIAGNOSIS — M129 Arthropathy, unspecified: Secondary | ICD-10-CM | POA: Diagnosis not present

## 2021-02-20 DIAGNOSIS — E611 Iron deficiency: Secondary | ICD-10-CM | POA: Diagnosis not present

## 2021-02-21 ENCOUNTER — Other Ambulatory Visit: Payer: Self-pay | Admitting: *Deleted

## 2021-02-21 ENCOUNTER — Encounter: Payer: Self-pay | Admitting: *Deleted

## 2021-02-22 DIAGNOSIS — Z79899 Other long term (current) drug therapy: Secondary | ICD-10-CM | POA: Diagnosis not present

## 2021-02-24 ENCOUNTER — Ambulatory Visit (INDEPENDENT_AMBULATORY_CARE_PROVIDER_SITE_OTHER): Payer: Medicare Other | Admitting: Diagnostic Neuroimaging

## 2021-02-24 ENCOUNTER — Encounter: Payer: Self-pay | Admitting: Diagnostic Neuroimaging

## 2021-02-24 VITALS — BP 138/99 | HR 100 | Ht 66.0 in | Wt 263.0 lb

## 2021-02-24 DIAGNOSIS — G032 Benign recurrent meningitis [Mollaret]: Secondary | ICD-10-CM | POA: Diagnosis not present

## 2021-02-24 DIAGNOSIS — G43111 Migraine with aura, intractable, with status migrainosus: Secondary | ICD-10-CM

## 2021-02-24 DIAGNOSIS — G030 Nonpyogenic meningitis: Secondary | ICD-10-CM | POA: Diagnosis not present

## 2021-02-24 MED ORDER — NURTEC 75 MG PO TBDP: 75.0000 mg | ORAL_TABLET | ORAL | 4 refills | Status: DC

## 2021-02-24 NOTE — Patient Instructions (Signed)
  MIGRAINE WITH AURA - increase nurtec to 75mg  every other day (for prevention) - continue sumatriptan 100mg  as needed - may consider topiramate, aimovig in future  RECURRENT ASEPTIC / VIRAL MENINGITIS (~2001, ~2012, July 2022) - refer to ID clinic consult; eval for mollaret's meningitis or other cause

## 2021-02-24 NOTE — Progress Notes (Signed)
[facility]  PATIENT: Ann Perez DOB: 01-25-1976  REFERRING CLINICIAN: Mateo Flow, MD HISTORY FROM: PATIENT  REASON FOR VISIT: new consult    HISTORICAL  CHIEF COMPLAINT:  Chief Complaint  Patient presents with   Pain    Rm 7 alone Pt is well, has had meningitis 3 times in her life. She now has daily headaches with it constantly.  Occasional nausea with headaches. Does have neuropathy in neck/back as well     HISTORY OF PRESENT ILLNESS:   45 year old female with history of meningitis x3, here for evaluation of headaches.  Patient has had 3 bouts of aseptic/viral meningitis when she was in her 15s, 72s and 40s.  These occurred approximately 2001, 2012 and 2022.  Recently she was in the hospital July 2022 for upper respiratory symptoms, malaise and headaches.  She was evaluated with extensive testing and spinal fluid analysis showed greater than 300 white cells, lymphocytic predominant with elevated protein.  Viral and bacterial testing were negative.  Since her 88s she has had issues with headaches and tried topiramate, Imitrex, Nurtec and Aimovig.  Nurtec and Aimovig have helped in the past.  Currently she is on Imitrex and Nurtec.  Her mother has migraines.  Patient has 5 to 6 days of headache per week with temporal, retro-orbital throbbing severe pain associate with nausea, photophobia, phonophobia, neck pain and seeing spots and sparkles.     REVIEW OF SYSTEMS: Full 14 system review of systems performed and negative with exception of: As per HPI.  ALLERGIES: Allergies  Allergen Reactions   Bee Venom Anaphylaxis    Epi pen Epi pen    Wound Dressing Adhesive Rash and Hives    Blisters Steri strips and surgical adhesive     Other Rash and Hives    Skin glue Steri Strips - Blisters Steri strips and surgical adhesive     Tape Hives and Rash    Steri strips    HOME MEDICATIONS: Outpatient Medications Prior to Visit  Medication Sig  Dispense Refill   buPROPion (WELLBUTRIN XL) 300 MG 24 hr tablet Take 300 mg by mouth daily.     cetirizine (ZYRTEC) 10 MG tablet Take 10 mg by mouth daily.     cyclobenzaprine (FLEXERIL) 5 MG tablet      EPINEPHrine (EPI-PEN) 0.3 mg/0.3 mL DEVI Inject 0.3 mg into the muscle once.     levothyroxine (SYNTHROID) 75 MCG tablet 75 mcg daily.     meloxicam (MOBIC) 7.5 MG tablet      omeprazole (PRILOSEC) 40 MG capsule Take 40 mg by mouth daily.     oxyCODONE-acetaminophen (PERCOCET/ROXICET) 5-325 MG tablet Take by mouth. 3 x day as needed     phentermine (ADIPEX-P) 37.5 MG tablet Take by mouth.     pregabalin (LYRICA) 75 MG capsule Take 75 mg by mouth 2 (two) times daily.     SUMAtriptan (IMITREX) 100 MG tablet as needed.     NURTEC 75 MG TBDP as needed.     clonazePAM (KLONOPIN) 0.5 MG tablet Take 1 mg by mouth 3 (three) times daily as needed.     FLUoxetine (PROZAC) 40 MG capsule Take 40 mg by mouth daily.     No facility-administered medications prior to visit.    PAST MEDICAL HISTORY: Past Medical History:  Diagnosis Date   Anxiety and depression    Aseptic meningitis    Chronic pain    GERD (gastroesophageal reflux disease)    Headache    Hernia  Hypothyroidism    Osteoarthritis    Peripheral neuropathy    Viral meningitis    2022 over 10 years ago    PAST SURGICAL HISTORY: Past Surgical History:  Procedure Laterality Date   CESAREAN SECTION     CHOLECYSTECTOMY     HERNIA REPAIR  04/30/2002   x5 right groin   paniculectomy  06/28/2009   THYROIDECTOMY, PARTIAL      FAMILY HISTORY: Family History  Problem Relation Age of Onset   Cancer Maternal Grandmother        breast   Cancer Maternal Grandfather    Cancer Paternal Grandmother        brain   Cancer Cousin        ovarian    SOCIAL HISTORY: Social History   Socioeconomic History   Marital status: Single    Spouse name: Not on file   Number of children: Not on file   Years of education: Not on file    Highest education level: Not on file  Occupational History   Not on file  Tobacco Use   Smoking status: Every Day    Packs/day: 1.00    Years: 29.00    Pack years: 29.00    Types: Cigarettes   Smokeless tobacco: Never   Tobacco comments:    Smoked since age 5  Substance and Sexual Activity   Alcohol use: No   Drug use: No   Sexual activity: Never  Other Topics Concern   Not on file  Social History Narrative   Lives with family   Social Determinants of Health   Financial Resource Strain: Not on file  Food Insecurity: Not on file  Transportation Needs: Not on file  Physical Activity: Not on file  Stress: Not on file  Social Connections: Not on file  Intimate Partner Violence: Not on file     PHYSICAL EXAM  GENERAL EXAM/CONSTITUTIONAL: Vitals:  Vitals:   02/24/21 1016  BP: (!) 138/99  Pulse: 100  Weight: 263 lb (119.3 kg)  Height: 5\' 6"  (1.676 m)   Body mass index is 42.45 kg/m. Wt Readings from Last 3 Encounters:  02/24/21 263 lb (119.3 kg)  08/06/11 259 lb 6.4 oz (117.7 kg)  07/31/11 236 lb (107 kg)   Patient is in no distress; well developed, nourished and groomed; neck is supple  CARDIOVASCULAR: Examination of carotid arteries is normal; no carotid bruits Regular rate and rhythm, no murmurs Examination of peripheral vascular system by observation and palpation is normal  EYES: Ophthalmoscopic exam of optic discs and posterior segments is normal; no papilledema or hemorrhages No results found.  MUSCULOSKELETAL: Gait, strength, tone, movements noted in Neurologic exam below  NEUROLOGIC: MENTAL STATUS:  No flowsheet data found. awake, alert, oriented to person, place and time recent and remote memory intact normal attention and concentration language fluent, comprehension intact, naming intact fund of knowledge appropriate  CRANIAL NERVE:  2nd - no papilledema on fundoscopic exam 2nd, 3rd, 4th, 6th - pupils equal and reactive to light, visual  fields full to confrontation, extraocular muscles intact, no nystagmus 5th - facial sensation symmetric 7th - facial strength symmetric 8th - hearing intact 9th - palate elevates symmetrically, uvula midline 11th - shoulder shrug symmetric 12th - tongue protrusion midline  MOTOR: normal bulk and tone, full strength in the BUE, BLE  SENSORY: normal and symmetric to light touch, temperature, vibration  COORDINATION:  finger-nose-finger, fine finger movements normal  REFLEXES:  deep tendon reflexes TRACE and symmetric  GAIT/STATION:  narrow based gait     DIAGNOSTIC DATA (LABS, IMAGING, TESTING) - I reviewed patient records, labs, notes, testing and imaging myself where available.  Lab Results  Component Value Date   WBC 7.4 07/31/2011   HGB 14.3 07/31/2011   HCT 42.0 07/31/2011   MCV 88.7 07/31/2011   PLT 221 07/31/2011      Component Value Date/Time   NA 141 07/31/2011 1652   K 3.5 07/31/2011 1652   CL 108 07/31/2011 1652   GLUCOSE 70 07/31/2011 1652   BUN 10 07/31/2011 1652   CREATININE 0.70 07/31/2011 1652   No results found for: CHOL, HDL, LDLCALC, LDLDIRECT, TRIG, CHOLHDL No results found for: HGBA1C No results found for: VITAMINB12 No results found for: TSH     ASSESSMENT AND PLAN  45 y.o. year old female here with headaches, likely migraine with aura, and 3 episodes of aseptic/viral meningitis.   Dx:  1. Intractable migraine with aura with status migrainosus   2. Aseptic meningitis   3. Benign recurrent aseptic meningitis       PLAN:  MIGRAINE WITH AURA - increase nurtec to 75mg  every other day (for prevention) - continue sumatriptan 100mg  as needed - may consider topiramate, aimovig in future  RECURRENT ASEPTIC / VIRAL MENINGITIS (~2001, ~2012, July 2022) - refer to ID clinic consult; eval for mollaret's meningitis or other cause  Orders Placed This Encounter  Procedures   Ambulatory referral to Infectious Disease   Meds ordered  this encounter  Medications   Rimegepant Sulfate (NURTEC) 75 MG TBDP    Sig: Take 75 mg by mouth every other day.    Dispense:  45 tablet    Refill:  4   Return in about 6 months (around 08/25/2021).    Penni Bombard, MD 54/27/0623, 76:28 AM Certified in Neurology, Neurophysiology and Neuroimaging  [facility] [address] [place], [place] [address] [phone]

## 2021-03-01 ENCOUNTER — Ambulatory Visit: Payer: Medicare Other | Admitting: Internal Medicine

## 2021-03-07 ENCOUNTER — Other Ambulatory Visit: Payer: Self-pay

## 2021-03-07 ENCOUNTER — Telehealth: Payer: Self-pay

## 2021-03-07 ENCOUNTER — Ambulatory Visit (INDEPENDENT_AMBULATORY_CARE_PROVIDER_SITE_OTHER): Payer: Medicare Other | Admitting: Internal Medicine

## 2021-03-07 ENCOUNTER — Encounter: Payer: Self-pay | Admitting: Internal Medicine

## 2021-03-07 VITALS — BP 136/87 | HR 90 | Wt 268.0 lb

## 2021-03-07 DIAGNOSIS — G039 Meningitis, unspecified: Secondary | ICD-10-CM | POA: Diagnosis not present

## 2021-03-07 NOTE — Progress Notes (Signed)
Patient: Ann Perez  DOB: 07-26-1975 MRN: [id number] PCP: Mateo Flow, MD  Referring Provider: Dr. Leta Baptist  Chief Complaint  Patient presents with   New Patient (Initial Visit)        Subjective:  Ann Perez is a 45 y.o. F with PMHx as below presents for management of recurrent meningitis. She was referred by Neurology, Dr Leta Baptist for concern for Mollaret's meningitis. Her latest episode was in July, 2022. Per neurology her CSF showed >300 wbc, lymphocytic predominance and elevated protein.  Pt has 3 episodes of meningitis(2001,2012, 2022)  requirng hospitlaization, all have been at [facility]. -First episode: pt was told it was viral in early 20s. Hospitalized for about 5 days, she belives she was discharged on pain meds. 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  -Second episode: in early 13s and she was hospitalized for 5 days. She believes she was told it was viral meningitis. 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  -Third episode of viral meningitis: July, 2022   Pt reports episode was not "as bad". Sent home on pain meds. She was around animals as well at that time.  -Pt had LP  each time. She reports her migraines have worsened after last episode of meningitis. She reprots she has worsing memory loss and balance.  Symptoms  generally start with HA followed by neck stiffness, photophobia. She has associated fever with nuasea/vomting.  -Believes she may had oral herpes on her tongue, treated by ENT at [facility] in 2020.  Social: She is disabled due to hernia surgeries and back pain. Tb test 15 years ago was negative. She used to work with developmentally disabled individuals Smoking/Etoh/drug use:  1PPD x 30years smoking history, social drinker, no drug use Travel: [place]. Never left he [place] Hobbies:  Rides ATVs on the farm. She is   around cows, goats chicken, ducks, horses, dogs and cats. Pt used to birth goats, last time was 9 years ago. She now feeds horses, goats , chickens.  -Enjoys  swimming  in ponds  Review of Systems  Constitutional:  Negative for chills and fever.  HENT:  Negative for congestion and sore throat.   Eyes:  Negative for blurred vision and photophobia.  Respiratory:  Negative for cough and shortness of breath.   Cardiovascular:  Negative for chest pain and palpitations.  Gastrointestinal:  Negative for diarrhea, nausea and vomiting.  Genitourinary: Negative.   Musculoskeletal: Negative.   Skin:  Negative for rash.  Neurological: Negative.   Endo/Heme/Allergies: Negative.   Psychiatric/Behavioral: Negative.     Past Medical History:  Diagnosis Date   Anxiety and depression    Aseptic meningitis    Chronic pain    GERD (gastroesophageal reflux disease)    Headache    Hernia    Hypothyroidism    Osteoarthritis    Peripheral neuropathy    Viral meningitis    2022 over 10 years ago    Outpatient Medications Prior to Visit  Medication Sig Dispense Refill   buPROPion (WELLBUTRIN XL) 300 MG 24 hr tablet Take 300 mg by mouth daily.     cetirizine (ZYRTEC) 10 MG tablet Take 10 mg by mouth daily.     cyclobenzaprine (FLEXERIL) 5 MG tablet      EPINEPHrine (EPI-PEN) 0.3 mg/0.3 mL DEVI Inject 0.3 mg into the muscle once.     levothyroxine (SYNTHROID)  75 MCG tablet 75 mcg daily.     meloxicam (MOBIC) 7.5 MG tablet      omeprazole (PRILOSEC) 40 MG capsule Take 40 mg by mouth daily.     oxyCODONE-acetaminophen (PERCOCET/ROXICET) 5-325 MG tablet Take by mouth. 3 x day as needed     phentermine (ADIPEX-P) 37.5 MG tablet Take by mouth.     pregabalin (LYRICA) 75 MG capsule Take 75 mg by mouth 2 (two) times daily.     Rimegepant Sulfate (NURTEC) 75 MG TBDP Take 75 mg by mouth every other day. 45 tablet 4   SUMAtriptan (IMITREX) 100 MG tablet as needed.     No facility-administered medications  prior to visit.     Allergies  Allergen Reactions   Bee Venom Anaphylaxis    Epi pen Epi pen    Wound Dressing Adhesive Rash and Hives    Blisters Steri strips and surgical adhesive     Other Rash and Hives    Skin glue Steri Strips - Blisters Steri strips and surgical adhesive     Tape Hives and Rash    Steri strips    Social History   Tobacco Use   Smoking status: Every Day    Packs/day: 1.00    Years: 29.00    Pack years: 29.00    Types: Cigarettes   Smokeless tobacco: Never   Tobacco comments:    Smoked since age 9  Substance Use Topics   Alcohol use: No   Drug use: No    Family History  Problem Relation Age of Onset   Cancer Maternal Grandmother        breast   Cancer Maternal Grandfather    Cancer Paternal Grandmother        brain   Cancer Cousin        ovarian    Objective:   Vitals:   03/07/21 1049  BP: 136/87  Pulse: 90  Weight: 121.6 kg   Body mass index is 43.26 kg/m.  Physical Exam Constitutional:      Appearance: Normal appearance.  HENT:     Head: Normocephalic and atraumatic.     Right Ear: Tympanic membrane normal.     Left Ear: Tympanic membrane normal.     Nose: Nose normal.     Mouth/Throat:     Mouth: Mucous membranes are moist.  Eyes:     Extraocular Movements: Extraocular movements intact.     Conjunctiva/sclera: Conjunctivae normal.     Pupils: Pupils are equal, round, and reactive to light.  Cardiovascular:     Rate and Rhythm: Normal rate and regular rhythm.     Heart sounds: No murmur heard.   No friction rub. No gallop.  Pulmonary:     Effort: Pulmonary effort is normal.     Breath sounds: Normal breath sounds.  Abdominal:     General: Abdomen is flat.     Palpations: Abdomen is soft.  Musculoskeletal:        General: Normal range of motion.  Skin:    General: Skin is warm and dry.  Neurological:     General: No focal deficit present.     Mental Status: She is alert and oriented to person, place, and  time.  Psychiatric:        Mood and Affect: Mood normal.    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  -Mollaret meningitis can be 2/2 HSV-2 which can occur without a Hx of genital outbreaks. The diagnostic criterion includes: attacks separated by symptom-free periods, spontaneous remission of symptoms and signs, recurrent episode of severe headache, meningismus and fever, CSF with pleocytosis of neutrophils,lymphocytes and endothelial cells,  no causative agent identified.Based on the history provided she does meet the criterion for Mollaret's.  -I would need records to investigate if additional tests are needed for work-up  would like to avoid repetition.  Recommendations: -[place] records of  hospitalizations in 2022, 2012 and 2001 -Obtain records from Dr. Cletis [facility] as [facility] -Follow up in 3 weeks via tele-visit after record have been received and reviewed.   Laurice Record, MD [place] [facility]   03/07/21  10:56 AM

## 2021-03-07 NOTE — Telephone Encounter (Signed)
Medical records release forms faxed to:  [place] [phone] (fax) Dr. Cletis [facility]: [phone] (fax)  Carlean Purl, RN

## 2021-03-16 ENCOUNTER — Telehealth: Payer: Self-pay | Admitting: Diagnostic Neuroimaging

## 2021-03-16 DIAGNOSIS — G43111 Migraine with aura, intractable, with status migrainosus: Secondary | ICD-10-CM

## 2021-03-16 MED ORDER — NURTEC 75 MG PO TBDP: 75.0000 mg | ORAL_TABLET | ORAL | 4 refills | Status: AC

## 2021-03-16 NOTE — Telephone Encounter (Signed)
Pt called, prescription for Rimegepant Sulfate (NURTEC) 75 MG TBDPcould not be filled at [place] because did not have the medication.  They sent prescription to  [place],   [phone] [facility].  Would like [facility].

## 2021-03-16 NOTE — Telephone Encounter (Signed)
Nurtec Rx discontinued at [facility] drug, [facility] [place] as patient requested.

## 2021-03-20 DIAGNOSIS — E7800 Pure hypercholesterolemia, unspecified: Secondary | ICD-10-CM | POA: Diagnosis not present

## 2021-03-20 DIAGNOSIS — F17210 Nicotine dependence, cigarettes, uncomplicated: Secondary | ICD-10-CM | POA: Diagnosis not present

## 2021-03-20 DIAGNOSIS — M545 Low back pain, unspecified: Secondary | ICD-10-CM | POA: Diagnosis not present

## 2021-03-20 DIAGNOSIS — M129 Arthropathy, unspecified: Secondary | ICD-10-CM | POA: Diagnosis not present

## 2021-03-20 DIAGNOSIS — G8929 Other chronic pain: Secondary | ICD-10-CM | POA: Diagnosis not present

## 2021-03-20 DIAGNOSIS — Z79899 Other long term (current) drug therapy: Secondary | ICD-10-CM | POA: Diagnosis not present

## 2021-03-20 DIAGNOSIS — R0602 Shortness of breath: Secondary | ICD-10-CM | POA: Diagnosis not present

## 2021-03-20 DIAGNOSIS — R030 Elevated blood-pressure reading, without diagnosis of hypertension: Secondary | ICD-10-CM | POA: Diagnosis not present

## 2021-03-22 DIAGNOSIS — Z79899 Other long term (current) drug therapy: Secondary | ICD-10-CM | POA: Diagnosis not present

## 2021-03-29 ENCOUNTER — Ambulatory Visit: Payer: Medicare Other | Admitting: Internal Medicine

## 2021-04-10 DIAGNOSIS — G8929 Other chronic pain: Secondary | ICD-10-CM | POA: Diagnosis not present

## 2021-04-10 DIAGNOSIS — E7800 Pure hypercholesterolemia, unspecified: Secondary | ICD-10-CM | POA: Diagnosis not present

## 2021-04-10 DIAGNOSIS — Z79899 Other long term (current) drug therapy: Secondary | ICD-10-CM | POA: Diagnosis not present

## 2021-04-10 DIAGNOSIS — R0602 Shortness of breath: Secondary | ICD-10-CM | POA: Diagnosis not present

## 2021-04-10 DIAGNOSIS — M545 Low back pain, unspecified: Secondary | ICD-10-CM | POA: Diagnosis not present

## 2021-04-10 DIAGNOSIS — F17210 Nicotine dependence, cigarettes, uncomplicated: Secondary | ICD-10-CM | POA: Diagnosis not present

## 2021-04-12 DIAGNOSIS — Z79899 Other long term (current) drug therapy: Secondary | ICD-10-CM | POA: Diagnosis not present

## 2021-05-02 DIAGNOSIS — H5213 Myopia, bilateral: Secondary | ICD-10-CM | POA: Diagnosis not present

## 2021-05-02 DIAGNOSIS — H43313 Vitreous membranes and strands, bilateral: Secondary | ICD-10-CM | POA: Diagnosis not present

## 2021-05-02 DIAGNOSIS — H5203 Hypermetropia, bilateral: Secondary | ICD-10-CM | POA: Diagnosis not present

## 2021-05-09 DIAGNOSIS — Z79899 Other long term (current) drug therapy: Secondary | ICD-10-CM | POA: Diagnosis not present

## 2021-05-09 DIAGNOSIS — E7800 Pure hypercholesterolemia, unspecified: Secondary | ICD-10-CM | POA: Diagnosis not present

## 2021-05-09 DIAGNOSIS — E039 Hypothyroidism, unspecified: Secondary | ICD-10-CM | POA: Diagnosis not present

## 2021-05-09 DIAGNOSIS — R7303 Prediabetes: Secondary | ICD-10-CM | POA: Diagnosis not present

## 2021-05-09 DIAGNOSIS — M129 Arthropathy, unspecified: Secondary | ICD-10-CM | POA: Diagnosis not present

## 2021-05-09 DIAGNOSIS — M545 Low back pain, unspecified: Secondary | ICD-10-CM | POA: Diagnosis not present

## 2021-05-09 DIAGNOSIS — E559 Vitamin D deficiency, unspecified: Secondary | ICD-10-CM | POA: Diagnosis not present

## 2021-05-09 DIAGNOSIS — G8929 Other chronic pain: Secondary | ICD-10-CM | POA: Diagnosis not present

## 2021-05-09 DIAGNOSIS — F17210 Nicotine dependence, cigarettes, uncomplicated: Secondary | ICD-10-CM | POA: Diagnosis not present

## 2021-05-09 DIAGNOSIS — E611 Iron deficiency: Secondary | ICD-10-CM | POA: Diagnosis not present

## 2021-05-09 DIAGNOSIS — Z0000 Encounter for general adult medical examination without abnormal findings: Secondary | ICD-10-CM | POA: Diagnosis not present

## 2021-05-09 DIAGNOSIS — R0602 Shortness of breath: Secondary | ICD-10-CM | POA: Diagnosis not present

## 2021-05-11 DIAGNOSIS — Z79899 Other long term (current) drug therapy: Secondary | ICD-10-CM | POA: Diagnosis not present

## 2021-05-23 DIAGNOSIS — M545 Low back pain, unspecified: Secondary | ICD-10-CM | POA: Diagnosis not present

## 2021-05-23 DIAGNOSIS — F17210 Nicotine dependence, cigarettes, uncomplicated: Secondary | ICD-10-CM | POA: Diagnosis not present

## 2021-05-23 DIAGNOSIS — R0602 Shortness of breath: Secondary | ICD-10-CM | POA: Diagnosis not present

## 2021-05-23 DIAGNOSIS — E7800 Pure hypercholesterolemia, unspecified: Secondary | ICD-10-CM | POA: Diagnosis not present

## 2021-05-23 DIAGNOSIS — Z23 Encounter for immunization: Secondary | ICD-10-CM | POA: Diagnosis not present

## 2021-05-23 DIAGNOSIS — G8929 Other chronic pain: Secondary | ICD-10-CM | POA: Diagnosis not present

## 2021-05-23 DIAGNOSIS — Z79899 Other long term (current) drug therapy: Secondary | ICD-10-CM | POA: Diagnosis not present

## 2021-05-25 DIAGNOSIS — Z79899 Other long term (current) drug therapy: Secondary | ICD-10-CM | POA: Diagnosis not present

## 2021-06-06 DIAGNOSIS — F17210 Nicotine dependence, cigarettes, uncomplicated: Secondary | ICD-10-CM | POA: Diagnosis not present

## 2021-06-06 DIAGNOSIS — G8929 Other chronic pain: Secondary | ICD-10-CM | POA: Diagnosis not present

## 2021-06-06 DIAGNOSIS — R0602 Shortness of breath: Secondary | ICD-10-CM | POA: Diagnosis not present

## 2021-06-06 DIAGNOSIS — Z79899 Other long term (current) drug therapy: Secondary | ICD-10-CM | POA: Diagnosis not present

## 2021-06-06 DIAGNOSIS — E7800 Pure hypercholesterolemia, unspecified: Secondary | ICD-10-CM | POA: Diagnosis not present

## 2021-06-06 DIAGNOSIS — M545 Low back pain, unspecified: Secondary | ICD-10-CM | POA: Diagnosis not present

## 2021-06-08 DIAGNOSIS — Z79899 Other long term (current) drug therapy: Secondary | ICD-10-CM | POA: Diagnosis not present

## 2021-07-06 DIAGNOSIS — G8929 Other chronic pain: Secondary | ICD-10-CM | POA: Diagnosis not present

## 2021-07-06 DIAGNOSIS — R7303 Prediabetes: Secondary | ICD-10-CM | POA: Diagnosis not present

## 2021-07-06 DIAGNOSIS — E7800 Pure hypercholesterolemia, unspecified: Secondary | ICD-10-CM | POA: Diagnosis not present

## 2021-07-06 DIAGNOSIS — F17210 Nicotine dependence, cigarettes, uncomplicated: Secondary | ICD-10-CM | POA: Diagnosis not present

## 2021-07-06 DIAGNOSIS — M545 Low back pain, unspecified: Secondary | ICD-10-CM | POA: Diagnosis not present

## 2021-07-06 DIAGNOSIS — Z79899 Other long term (current) drug therapy: Secondary | ICD-10-CM | POA: Diagnosis not present

## 2021-07-10 DIAGNOSIS — Z79899 Other long term (current) drug therapy: Secondary | ICD-10-CM | POA: Diagnosis not present

## 2021-08-03 DIAGNOSIS — R7303 Prediabetes: Secondary | ICD-10-CM | POA: Diagnosis not present

## 2021-08-03 DIAGNOSIS — F17210 Nicotine dependence, cigarettes, uncomplicated: Secondary | ICD-10-CM | POA: Diagnosis not present

## 2021-08-03 DIAGNOSIS — M545 Low back pain, unspecified: Secondary | ICD-10-CM | POA: Diagnosis not present

## 2021-08-03 DIAGNOSIS — R0602 Shortness of breath: Secondary | ICD-10-CM | POA: Diagnosis not present

## 2021-08-03 DIAGNOSIS — Z79899 Other long term (current) drug therapy: Secondary | ICD-10-CM | POA: Diagnosis not present

## 2021-08-03 DIAGNOSIS — G8929 Other chronic pain: Secondary | ICD-10-CM | POA: Diagnosis not present

## 2021-08-03 DIAGNOSIS — E7800 Pure hypercholesterolemia, unspecified: Secondary | ICD-10-CM | POA: Diagnosis not present

## 2021-08-08 DIAGNOSIS — Z79899 Other long term (current) drug therapy: Secondary | ICD-10-CM | POA: Diagnosis not present

## 2021-08-31 DIAGNOSIS — F17210 Nicotine dependence, cigarettes, uncomplicated: Secondary | ICD-10-CM | POA: Diagnosis not present

## 2021-08-31 DIAGNOSIS — Z79899 Other long term (current) drug therapy: Secondary | ICD-10-CM | POA: Diagnosis not present

## 2021-08-31 DIAGNOSIS — R7303 Prediabetes: Secondary | ICD-10-CM | POA: Diagnosis not present

## 2021-08-31 DIAGNOSIS — R030 Elevated blood-pressure reading, without diagnosis of hypertension: Secondary | ICD-10-CM | POA: Diagnosis not present

## 2021-08-31 DIAGNOSIS — E559 Vitamin D deficiency, unspecified: Secondary | ICD-10-CM | POA: Diagnosis not present

## 2021-08-31 DIAGNOSIS — G8929 Other chronic pain: Secondary | ICD-10-CM | POA: Diagnosis not present

## 2021-08-31 DIAGNOSIS — E7800 Pure hypercholesterolemia, unspecified: Secondary | ICD-10-CM | POA: Diagnosis not present

## 2021-08-31 DIAGNOSIS — E611 Iron deficiency: Secondary | ICD-10-CM | POA: Diagnosis not present

## 2021-08-31 DIAGNOSIS — R0602 Shortness of breath: Secondary | ICD-10-CM | POA: Diagnosis not present

## 2021-08-31 DIAGNOSIS — M545 Low back pain, unspecified: Secondary | ICD-10-CM | POA: Diagnosis not present

## 2021-09-04 DIAGNOSIS — Z79899 Other long term (current) drug therapy: Secondary | ICD-10-CM | POA: Diagnosis not present

## 2021-09-05 ENCOUNTER — Ambulatory Visit: Payer: Medicare Other | Admitting: Diagnostic Neuroimaging

## 2021-09-27 DIAGNOSIS — G8929 Other chronic pain: Secondary | ICD-10-CM | POA: Diagnosis not present

## 2021-09-27 DIAGNOSIS — Z79899 Other long term (current) drug therapy: Secondary | ICD-10-CM | POA: Diagnosis not present

## 2021-09-27 DIAGNOSIS — E7800 Pure hypercholesterolemia, unspecified: Secondary | ICD-10-CM | POA: Diagnosis not present

## 2021-09-27 DIAGNOSIS — F17210 Nicotine dependence, cigarettes, uncomplicated: Secondary | ICD-10-CM | POA: Diagnosis not present

## 2021-09-27 DIAGNOSIS — M545 Low back pain, unspecified: Secondary | ICD-10-CM | POA: Diagnosis not present

## 2021-09-27 DIAGNOSIS — R7303 Prediabetes: Secondary | ICD-10-CM | POA: Diagnosis not present

## 2021-09-29 DIAGNOSIS — Z79899 Other long term (current) drug therapy: Secondary | ICD-10-CM | POA: Diagnosis not present

## 2021-10-24 DIAGNOSIS — Z79899 Other long term (current) drug therapy: Secondary | ICD-10-CM | POA: Diagnosis not present

## 2021-10-24 DIAGNOSIS — G8929 Other chronic pain: Secondary | ICD-10-CM | POA: Diagnosis not present

## 2021-10-24 DIAGNOSIS — E7800 Pure hypercholesterolemia, unspecified: Secondary | ICD-10-CM | POA: Diagnosis not present

## 2021-10-24 DIAGNOSIS — M545 Low back pain, unspecified: Secondary | ICD-10-CM | POA: Diagnosis not present

## 2021-10-24 DIAGNOSIS — R7303 Prediabetes: Secondary | ICD-10-CM | POA: Diagnosis not present

## 2021-10-24 DIAGNOSIS — F17210 Nicotine dependence, cigarettes, uncomplicated: Secondary | ICD-10-CM | POA: Diagnosis not present

## 2021-10-24 DIAGNOSIS — M542 Cervicalgia: Secondary | ICD-10-CM | POA: Diagnosis not present

## 2021-10-26 DIAGNOSIS — Z79899 Other long term (current) drug therapy: Secondary | ICD-10-CM | POA: Diagnosis not present

## 2021-11-21 DIAGNOSIS — E7800 Pure hypercholesterolemia, unspecified: Secondary | ICD-10-CM | POA: Diagnosis not present

## 2021-11-21 DIAGNOSIS — Z79899 Other long term (current) drug therapy: Secondary | ICD-10-CM | POA: Diagnosis not present

## 2021-11-21 DIAGNOSIS — G8929 Other chronic pain: Secondary | ICD-10-CM | POA: Diagnosis not present

## 2021-11-21 DIAGNOSIS — F17210 Nicotine dependence, cigarettes, uncomplicated: Secondary | ICD-10-CM | POA: Diagnosis not present

## 2021-11-21 DIAGNOSIS — M542 Cervicalgia: Secondary | ICD-10-CM | POA: Diagnosis not present

## 2021-11-21 DIAGNOSIS — R7303 Prediabetes: Secondary | ICD-10-CM | POA: Diagnosis not present

## 2021-11-21 DIAGNOSIS — E559 Vitamin D deficiency, unspecified: Secondary | ICD-10-CM | POA: Diagnosis not present

## 2021-11-21 DIAGNOSIS — E611 Iron deficiency: Secondary | ICD-10-CM | POA: Diagnosis not present

## 2021-11-21 DIAGNOSIS — M545 Low back pain, unspecified: Secondary | ICD-10-CM | POA: Diagnosis not present

## 2021-11-21 DIAGNOSIS — R0602 Shortness of breath: Secondary | ICD-10-CM | POA: Diagnosis not present

## 2021-11-23 DIAGNOSIS — Z79899 Other long term (current) drug therapy: Secondary | ICD-10-CM | POA: Diagnosis not present

## 2021-12-21 DIAGNOSIS — N62 Hypertrophy of breast: Secondary | ICD-10-CM | POA: Diagnosis not present

## 2021-12-21 DIAGNOSIS — F17210 Nicotine dependence, cigarettes, uncomplicated: Secondary | ICD-10-CM | POA: Diagnosis not present

## 2021-12-21 DIAGNOSIS — G8929 Other chronic pain: Secondary | ICD-10-CM | POA: Diagnosis not present

## 2021-12-21 DIAGNOSIS — N183 Chronic kidney disease, stage 3 unspecified: Secondary | ICD-10-CM | POA: Diagnosis not present

## 2021-12-21 DIAGNOSIS — K589 Irritable bowel syndrome without diarrhea: Secondary | ICD-10-CM | POA: Diagnosis not present

## 2021-12-21 DIAGNOSIS — M542 Cervicalgia: Secondary | ICD-10-CM | POA: Diagnosis not present

## 2021-12-21 DIAGNOSIS — Z79899 Other long term (current) drug therapy: Secondary | ICD-10-CM | POA: Diagnosis not present

## 2021-12-21 DIAGNOSIS — R7303 Prediabetes: Secondary | ICD-10-CM | POA: Diagnosis not present

## 2021-12-21 DIAGNOSIS — M545 Low back pain, unspecified: Secondary | ICD-10-CM | POA: Diagnosis not present

## 2021-12-21 DIAGNOSIS — E559 Vitamin D deficiency, unspecified: Secondary | ICD-10-CM | POA: Diagnosis not present

## 2021-12-21 DIAGNOSIS — E611 Iron deficiency: Secondary | ICD-10-CM | POA: Diagnosis not present

## 2021-12-21 DIAGNOSIS — E7800 Pure hypercholesterolemia, unspecified: Secondary | ICD-10-CM | POA: Diagnosis not present

## 2021-12-24 DIAGNOSIS — Z79899 Other long term (current) drug therapy: Secondary | ICD-10-CM | POA: Diagnosis not present

## 2022-01-25 DIAGNOSIS — E7800 Pure hypercholesterolemia, unspecified: Secondary | ICD-10-CM | POA: Diagnosis not present

## 2022-01-25 DIAGNOSIS — M542 Cervicalgia: Secondary | ICD-10-CM | POA: Diagnosis not present

## 2022-01-25 DIAGNOSIS — R7303 Prediabetes: Secondary | ICD-10-CM | POA: Diagnosis not present

## 2022-01-25 DIAGNOSIS — F17210 Nicotine dependence, cigarettes, uncomplicated: Secondary | ICD-10-CM | POA: Diagnosis not present

## 2022-01-25 DIAGNOSIS — G8929 Other chronic pain: Secondary | ICD-10-CM | POA: Diagnosis not present

## 2022-01-25 DIAGNOSIS — E559 Vitamin D deficiency, unspecified: Secondary | ICD-10-CM | POA: Diagnosis not present

## 2022-01-25 DIAGNOSIS — R0602 Shortness of breath: Secondary | ICD-10-CM | POA: Diagnosis not present

## 2022-01-25 DIAGNOSIS — Z79899 Other long term (current) drug therapy: Secondary | ICD-10-CM | POA: Diagnosis not present

## 2022-01-25 DIAGNOSIS — M545 Low back pain, unspecified: Secondary | ICD-10-CM | POA: Diagnosis not present

## 2022-01-25 DIAGNOSIS — E611 Iron deficiency: Secondary | ICD-10-CM | POA: Diagnosis not present

## 2022-01-26 DIAGNOSIS — Z79899 Other long term (current) drug therapy: Secondary | ICD-10-CM | POA: Diagnosis not present

## 2022-02-05 ENCOUNTER — Encounter: Payer: Self-pay | Admitting: Diagnostic Neuroimaging

## 2022-02-05 ENCOUNTER — Ambulatory Visit: Payer: Medicare Other | Admitting: Diagnostic Neuroimaging

## 2022-02-08 DIAGNOSIS — E611 Iron deficiency: Secondary | ICD-10-CM | POA: Diagnosis not present

## 2022-02-08 DIAGNOSIS — M545 Low back pain, unspecified: Secondary | ICD-10-CM | POA: Diagnosis not present

## 2022-02-08 DIAGNOSIS — E559 Vitamin D deficiency, unspecified: Secondary | ICD-10-CM | POA: Diagnosis not present

## 2022-02-08 DIAGNOSIS — R0602 Shortness of breath: Secondary | ICD-10-CM | POA: Diagnosis not present

## 2022-02-08 DIAGNOSIS — R7303 Prediabetes: Secondary | ICD-10-CM | POA: Diagnosis not present

## 2022-02-08 DIAGNOSIS — G8929 Other chronic pain: Secondary | ICD-10-CM | POA: Diagnosis not present

## 2022-02-08 DIAGNOSIS — M542 Cervicalgia: Secondary | ICD-10-CM | POA: Diagnosis not present

## 2022-02-08 DIAGNOSIS — E7800 Pure hypercholesterolemia, unspecified: Secondary | ICD-10-CM | POA: Diagnosis not present

## 2022-02-08 DIAGNOSIS — F17210 Nicotine dependence, cigarettes, uncomplicated: Secondary | ICD-10-CM | POA: Diagnosis not present

## 2022-02-26 DIAGNOSIS — E611 Iron deficiency: Secondary | ICD-10-CM | POA: Diagnosis not present

## 2022-02-26 DIAGNOSIS — J018 Other acute sinusitis: Secondary | ICD-10-CM | POA: Diagnosis not present

## 2022-02-26 DIAGNOSIS — M545 Low back pain, unspecified: Secondary | ICD-10-CM | POA: Diagnosis not present

## 2022-02-26 DIAGNOSIS — E559 Vitamin D deficiency, unspecified: Secondary | ICD-10-CM | POA: Diagnosis not present

## 2022-02-26 DIAGNOSIS — Z79899 Other long term (current) drug therapy: Secondary | ICD-10-CM | POA: Diagnosis not present

## 2022-02-26 DIAGNOSIS — M542 Cervicalgia: Secondary | ICD-10-CM | POA: Diagnosis not present

## 2022-02-26 DIAGNOSIS — F17210 Nicotine dependence, cigarettes, uncomplicated: Secondary | ICD-10-CM | POA: Diagnosis not present

## 2022-02-26 DIAGNOSIS — E7800 Pure hypercholesterolemia, unspecified: Secondary | ICD-10-CM | POA: Diagnosis not present

## 2022-02-26 DIAGNOSIS — R7303 Prediabetes: Secondary | ICD-10-CM | POA: Diagnosis not present

## 2022-02-28 DIAGNOSIS — Z79899 Other long term (current) drug therapy: Secondary | ICD-10-CM | POA: Diagnosis not present

## 2022-03-06 DIAGNOSIS — E890 Postprocedural hypothyroidism: Secondary | ICD-10-CM | POA: Diagnosis not present

## 2022-03-06 DIAGNOSIS — Z2821 Immunization not carried out because of patient refusal: Secondary | ICD-10-CM | POA: Diagnosis not present

## 2022-03-06 DIAGNOSIS — R21 Rash and other nonspecific skin eruption: Secondary | ICD-10-CM | POA: Diagnosis not present

## 2022-03-06 DIAGNOSIS — F17200 Nicotine dependence, unspecified, uncomplicated: Secondary | ICD-10-CM | POA: Diagnosis not present

## 2022-04-03 DIAGNOSIS — E559 Vitamin D deficiency, unspecified: Secondary | ICD-10-CM | POA: Diagnosis not present

## 2022-04-03 DIAGNOSIS — M542 Cervicalgia: Secondary | ICD-10-CM | POA: Diagnosis not present

## 2022-04-03 DIAGNOSIS — N183 Chronic kidney disease, stage 3 unspecified: Secondary | ICD-10-CM | POA: Diagnosis not present

## 2022-04-03 DIAGNOSIS — R7309 Other abnormal glucose: Secondary | ICD-10-CM | POA: Diagnosis not present

## 2022-04-03 DIAGNOSIS — E611 Iron deficiency: Secondary | ICD-10-CM | POA: Diagnosis not present

## 2022-04-03 DIAGNOSIS — R7303 Prediabetes: Secondary | ICD-10-CM | POA: Diagnosis not present

## 2022-04-03 DIAGNOSIS — F17210 Nicotine dependence, cigarettes, uncomplicated: Secondary | ICD-10-CM | POA: Diagnosis not present

## 2022-04-03 DIAGNOSIS — E7800 Pure hypercholesterolemia, unspecified: Secondary | ICD-10-CM | POA: Diagnosis not present

## 2022-04-03 DIAGNOSIS — Z79899 Other long term (current) drug therapy: Secondary | ICD-10-CM | POA: Diagnosis not present

## 2022-04-03 DIAGNOSIS — K589 Irritable bowel syndrome without diarrhea: Secondary | ICD-10-CM | POA: Diagnosis not present

## 2022-04-03 DIAGNOSIS — M545 Low back pain, unspecified: Secondary | ICD-10-CM | POA: Diagnosis not present

## 2022-04-03 DIAGNOSIS — N62 Hypertrophy of breast: Secondary | ICD-10-CM | POA: Diagnosis not present

## 2022-04-05 DIAGNOSIS — Z79899 Other long term (current) drug therapy: Secondary | ICD-10-CM | POA: Diagnosis not present

## 2022-04-06 ENCOUNTER — Other Ambulatory Visit: Payer: Self-pay | Admitting: Diagnostic Neuroimaging

## 2022-04-06 DIAGNOSIS — G43111 Migraine with aura, intractable, with status migrainosus: Secondary | ICD-10-CM

## 2022-05-06 DIAGNOSIS — M542 Cervicalgia: Secondary | ICD-10-CM | POA: Diagnosis not present

## 2022-05-06 DIAGNOSIS — Z0000 Encounter for general adult medical examination without abnormal findings: Secondary | ICD-10-CM | POA: Diagnosis not present

## 2022-05-06 DIAGNOSIS — R7303 Prediabetes: Secondary | ICD-10-CM | POA: Diagnosis not present

## 2022-05-06 DIAGNOSIS — M545 Low back pain, unspecified: Secondary | ICD-10-CM | POA: Diagnosis not present

## 2022-05-06 DIAGNOSIS — R0602 Shortness of breath: Secondary | ICD-10-CM | POA: Diagnosis not present

## 2022-05-06 DIAGNOSIS — F17210 Nicotine dependence, cigarettes, uncomplicated: Secondary | ICD-10-CM | POA: Diagnosis not present

## 2022-05-06 DIAGNOSIS — Z79899 Other long term (current) drug therapy: Secondary | ICD-10-CM | POA: Diagnosis not present

## 2022-05-06 DIAGNOSIS — G8929 Other chronic pain: Secondary | ICD-10-CM | POA: Diagnosis not present

## 2022-05-06 DIAGNOSIS — E7800 Pure hypercholesterolemia, unspecified: Secondary | ICD-10-CM | POA: Diagnosis not present

## 2022-05-09 DIAGNOSIS — Z79899 Other long term (current) drug therapy: Secondary | ICD-10-CM | POA: Diagnosis not present

## 2022-05-28 DIAGNOSIS — L821 Other seborrheic keratosis: Secondary | ICD-10-CM | POA: Diagnosis not present

## 2022-05-28 DIAGNOSIS — D485 Neoplasm of uncertain behavior of skin: Secondary | ICD-10-CM | POA: Diagnosis not present

## 2022-06-05 ENCOUNTER — Telehealth: Payer: Self-pay

## 2022-06-05 NOTE — Patient Outreach (Signed)
  Care Coordination   06/05/2022 Name: Ann Perez MRN: [id number] DOB: [birth year]   Care Coordination Outreach Attempts:  An unsuccessful telephone outreach was attempted today to offer the patient information about available care coordination services as a benefit of their health plan.   Follow Up Plan:  Additional outreach attempts will be made to offer the patient care coordination information and services.   Encounter Outcome:  No Answer   Care Coordination Interventions:  No, not indicated    Tomasa Rand, RN, BSN, [facility] [employer] [phone]

## 2022-06-11 ENCOUNTER — Telehealth: Payer: Self-pay

## 2022-06-11 NOTE — Patient Outreach (Signed)
  Care Coordination   06/11/2022 Name: Ann Perez MRN: [id number] DOB: Jun 13, 1975   Care Coordination Outreach Attempts:  A second unsuccessful outreach was attempted today to offer the patient with information about available care coordination services as a benefit of their health plan.     Follow Up Plan:  Additional outreach attempts will be made to offer the patient care coordination information and services.   Encounter Outcome:  No Answer   Care Coordination Interventions:  No, not indicated    Tomasa Rand, RN, BSN, [facility] [employer] [phone]

## 2022-06-13 DIAGNOSIS — Z79899 Other long term (current) drug therapy: Secondary | ICD-10-CM | POA: Diagnosis not present

## 2022-06-13 DIAGNOSIS — E611 Iron deficiency: Secondary | ICD-10-CM | POA: Diagnosis not present

## 2022-06-13 DIAGNOSIS — R0602 Shortness of breath: Secondary | ICD-10-CM | POA: Diagnosis not present

## 2022-06-13 DIAGNOSIS — M545 Low back pain, unspecified: Secondary | ICD-10-CM | POA: Diagnosis not present

## 2022-06-13 DIAGNOSIS — R7303 Prediabetes: Secondary | ICD-10-CM | POA: Diagnosis not present

## 2022-06-13 DIAGNOSIS — E559 Vitamin D deficiency, unspecified: Secondary | ICD-10-CM | POA: Diagnosis not present

## 2022-06-13 DIAGNOSIS — E7800 Pure hypercholesterolemia, unspecified: Secondary | ICD-10-CM | POA: Diagnosis not present

## 2022-06-13 DIAGNOSIS — F17210 Nicotine dependence, cigarettes, uncomplicated: Secondary | ICD-10-CM | POA: Diagnosis not present

## 2022-06-18 DIAGNOSIS — Z79899 Other long term (current) drug therapy: Secondary | ICD-10-CM | POA: Diagnosis not present

## 2022-06-19 ENCOUNTER — Telehealth: Payer: Self-pay

## 2022-06-19 NOTE — Patient Outreach (Signed)
  Care Coordination   06/19/2022 Name: Ann Perez MRN: [id number] DOB: 04-02-1976   Care Coordination Outreach Attempts:  A third unsuccessful outreach was attempted today to offer the patient with information about available care coordination services as a benefit of their health plan.   Follow Up Plan:  No further outreach attempts will be made at this time. We have been unable to contact the patient to offer or enroll patient in care coordination services  Encounter Outcome:  No Answer   Care Coordination Interventions:  No, not indicated    Tomasa Rand, RN, BSN, [facility] [place] Coordinator [phone]

## 2022-07-24 DIAGNOSIS — Z79899 Other long term (current) drug therapy: Secondary | ICD-10-CM | POA: Diagnosis not present

## 2022-07-24 DIAGNOSIS — F17210 Nicotine dependence, cigarettes, uncomplicated: Secondary | ICD-10-CM | POA: Diagnosis not present

## 2022-07-24 DIAGNOSIS — M545 Low back pain, unspecified: Secondary | ICD-10-CM | POA: Diagnosis not present

## 2022-07-24 DIAGNOSIS — E611 Iron deficiency: Secondary | ICD-10-CM | POA: Diagnosis not present

## 2022-07-24 DIAGNOSIS — E7800 Pure hypercholesterolemia, unspecified: Secondary | ICD-10-CM | POA: Diagnosis not present

## 2022-07-24 DIAGNOSIS — N183 Chronic kidney disease, stage 3 unspecified: Secondary | ICD-10-CM | POA: Diagnosis not present

## 2022-07-24 DIAGNOSIS — R7303 Prediabetes: Secondary | ICD-10-CM | POA: Diagnosis not present

## 2022-07-24 DIAGNOSIS — E559 Vitamin D deficiency, unspecified: Secondary | ICD-10-CM | POA: Diagnosis not present

## 2022-07-26 DIAGNOSIS — Z79899 Other long term (current) drug therapy: Secondary | ICD-10-CM | POA: Diagnosis not present

## 2022-08-21 DIAGNOSIS — E7800 Pure hypercholesterolemia, unspecified: Secondary | ICD-10-CM | POA: Diagnosis not present

## 2022-08-21 DIAGNOSIS — M545 Low back pain, unspecified: Secondary | ICD-10-CM | POA: Diagnosis not present

## 2022-08-21 DIAGNOSIS — Z79899 Other long term (current) drug therapy: Secondary | ICD-10-CM | POA: Diagnosis not present

## 2022-08-21 DIAGNOSIS — E611 Iron deficiency: Secondary | ICD-10-CM | POA: Diagnosis not present

## 2022-08-21 DIAGNOSIS — E559 Vitamin D deficiency, unspecified: Secondary | ICD-10-CM | POA: Diagnosis not present

## 2022-08-21 DIAGNOSIS — R7303 Prediabetes: Secondary | ICD-10-CM | POA: Diagnosis not present

## 2022-08-21 DIAGNOSIS — N183 Chronic kidney disease, stage 3 unspecified: Secondary | ICD-10-CM | POA: Diagnosis not present

## 2022-08-21 DIAGNOSIS — G8929 Other chronic pain: Secondary | ICD-10-CM | POA: Diagnosis not present

## 2022-08-23 DIAGNOSIS — Z79899 Other long term (current) drug therapy: Secondary | ICD-10-CM | POA: Diagnosis not present

## 2022-09-25 DIAGNOSIS — G8929 Other chronic pain: Secondary | ICD-10-CM | POA: Diagnosis not present

## 2022-09-25 DIAGNOSIS — E611 Iron deficiency: Secondary | ICD-10-CM | POA: Diagnosis not present

## 2022-09-25 DIAGNOSIS — N183 Chronic kidney disease, stage 3 unspecified: Secondary | ICD-10-CM | POA: Diagnosis not present

## 2022-09-25 DIAGNOSIS — E7800 Pure hypercholesterolemia, unspecified: Secondary | ICD-10-CM | POA: Diagnosis not present

## 2022-09-25 DIAGNOSIS — R7303 Prediabetes: Secondary | ICD-10-CM | POA: Diagnosis not present

## 2022-09-25 DIAGNOSIS — E559 Vitamin D deficiency, unspecified: Secondary | ICD-10-CM | POA: Diagnosis not present

## 2022-09-25 DIAGNOSIS — M545 Low back pain, unspecified: Secondary | ICD-10-CM | POA: Diagnosis not present

## 2022-09-25 DIAGNOSIS — F17210 Nicotine dependence, cigarettes, uncomplicated: Secondary | ICD-10-CM | POA: Diagnosis not present

## 2022-09-25 DIAGNOSIS — Z79899 Other long term (current) drug therapy: Secondary | ICD-10-CM | POA: Diagnosis not present

## 2022-09-27 DIAGNOSIS — Z79899 Other long term (current) drug therapy: Secondary | ICD-10-CM | POA: Diagnosis not present

## 2022-10-23 DIAGNOSIS — Z79899 Other long term (current) drug therapy: Secondary | ICD-10-CM | POA: Diagnosis not present

## 2022-10-23 DIAGNOSIS — E611 Iron deficiency: Secondary | ICD-10-CM | POA: Diagnosis not present

## 2022-10-23 DIAGNOSIS — G8929 Other chronic pain: Secondary | ICD-10-CM | POA: Diagnosis not present

## 2022-10-23 DIAGNOSIS — M545 Low back pain, unspecified: Secondary | ICD-10-CM | POA: Diagnosis not present

## 2022-10-23 DIAGNOSIS — R059 Cough, unspecified: Secondary | ICD-10-CM | POA: Diagnosis not present

## 2022-10-23 DIAGNOSIS — E7800 Pure hypercholesterolemia, unspecified: Secondary | ICD-10-CM | POA: Diagnosis not present

## 2022-10-23 DIAGNOSIS — J018 Other acute sinusitis: Secondary | ICD-10-CM | POA: Diagnosis not present

## 2022-10-23 DIAGNOSIS — E559 Vitamin D deficiency, unspecified: Secondary | ICD-10-CM | POA: Diagnosis not present

## 2022-10-23 DIAGNOSIS — F17210 Nicotine dependence, cigarettes, uncomplicated: Secondary | ICD-10-CM | POA: Diagnosis not present

## 2022-10-23 DIAGNOSIS — R7303 Prediabetes: Secondary | ICD-10-CM | POA: Diagnosis not present

## 2022-10-25 DIAGNOSIS — Z79899 Other long term (current) drug therapy: Secondary | ICD-10-CM | POA: Diagnosis not present

## 2022-10-26 IMAGING — DX DG ANKLE COMPLETE 3+V*L*
3 series · 3 of 3 positions shown · non-contrast
Comparison: None.

CLINICAL DATA: Ankle pain.

EXAM:
LEFT ANKLE COMPLETE - 3+ VIEW

[ankle ap]
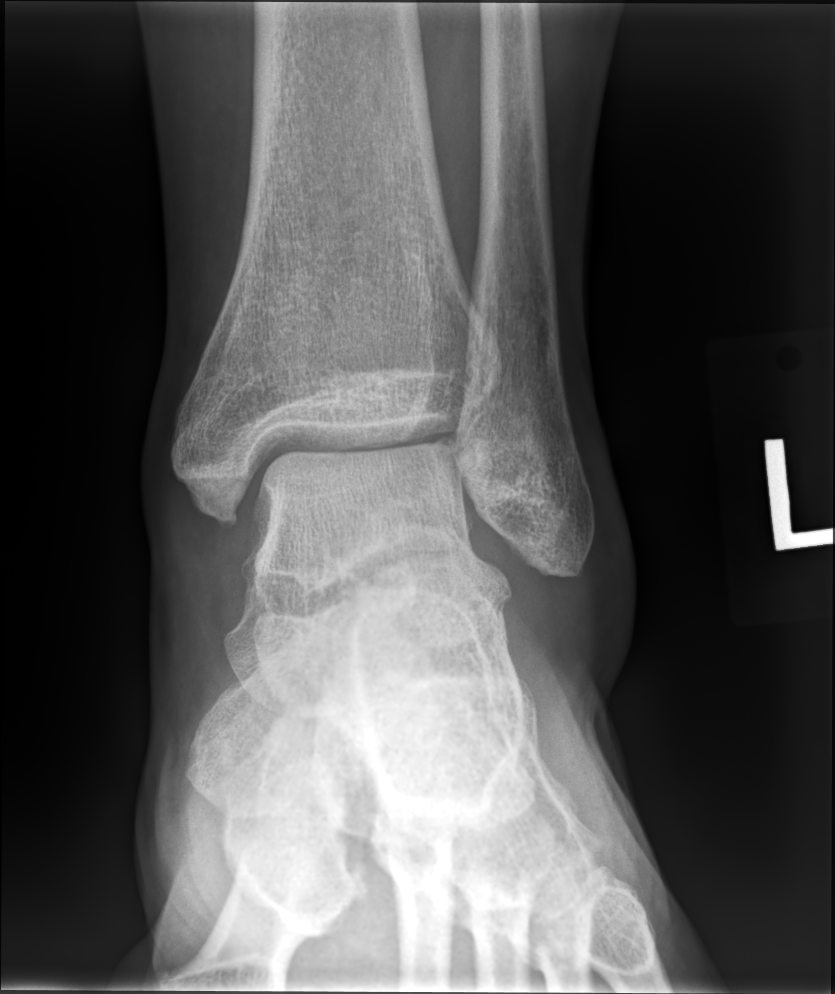

[ankle medial oblique]
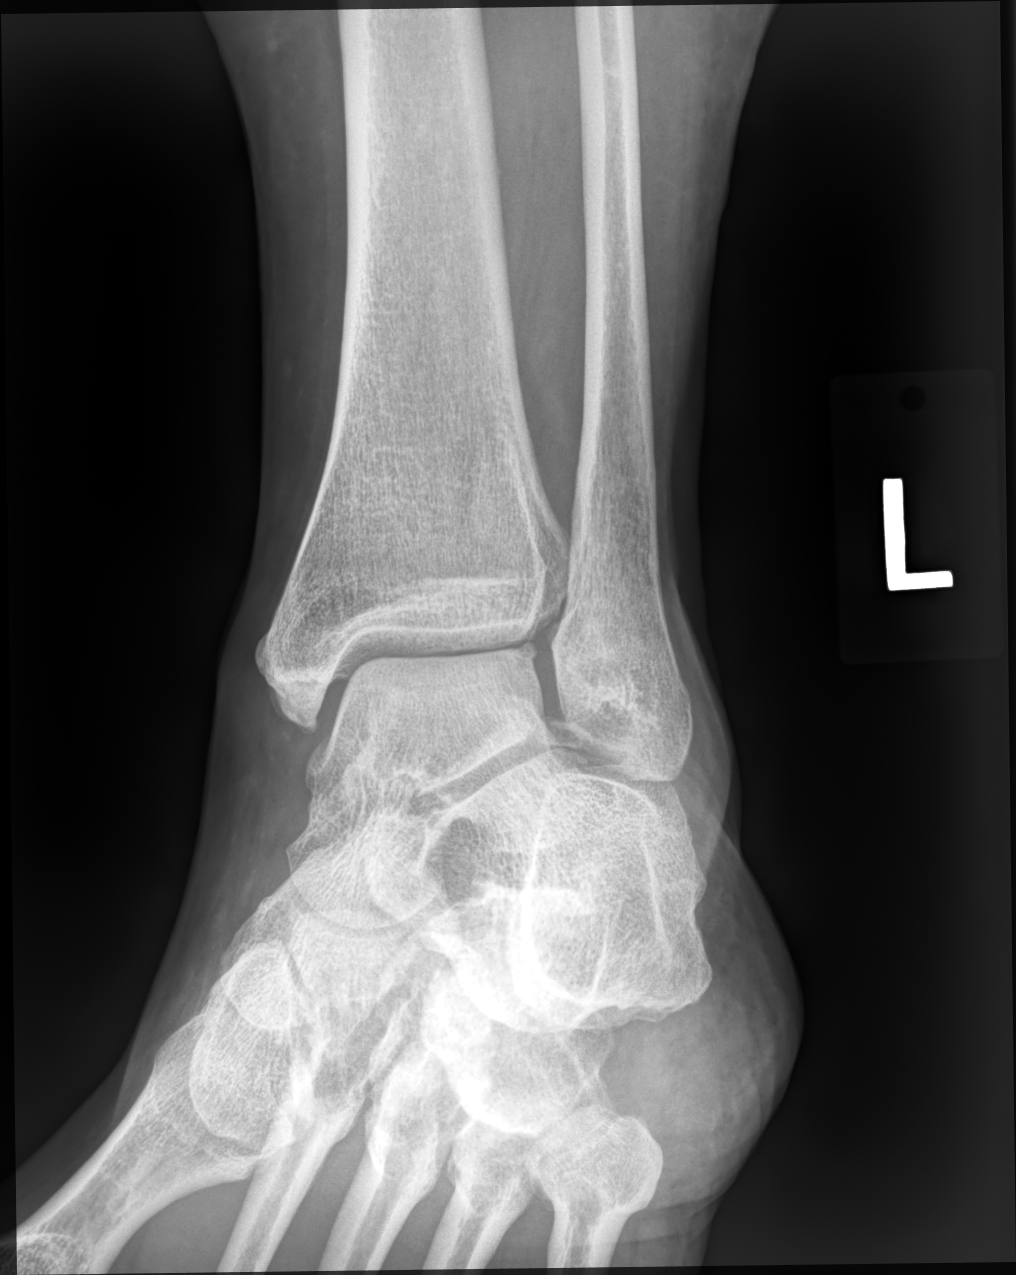

[ankle lat]
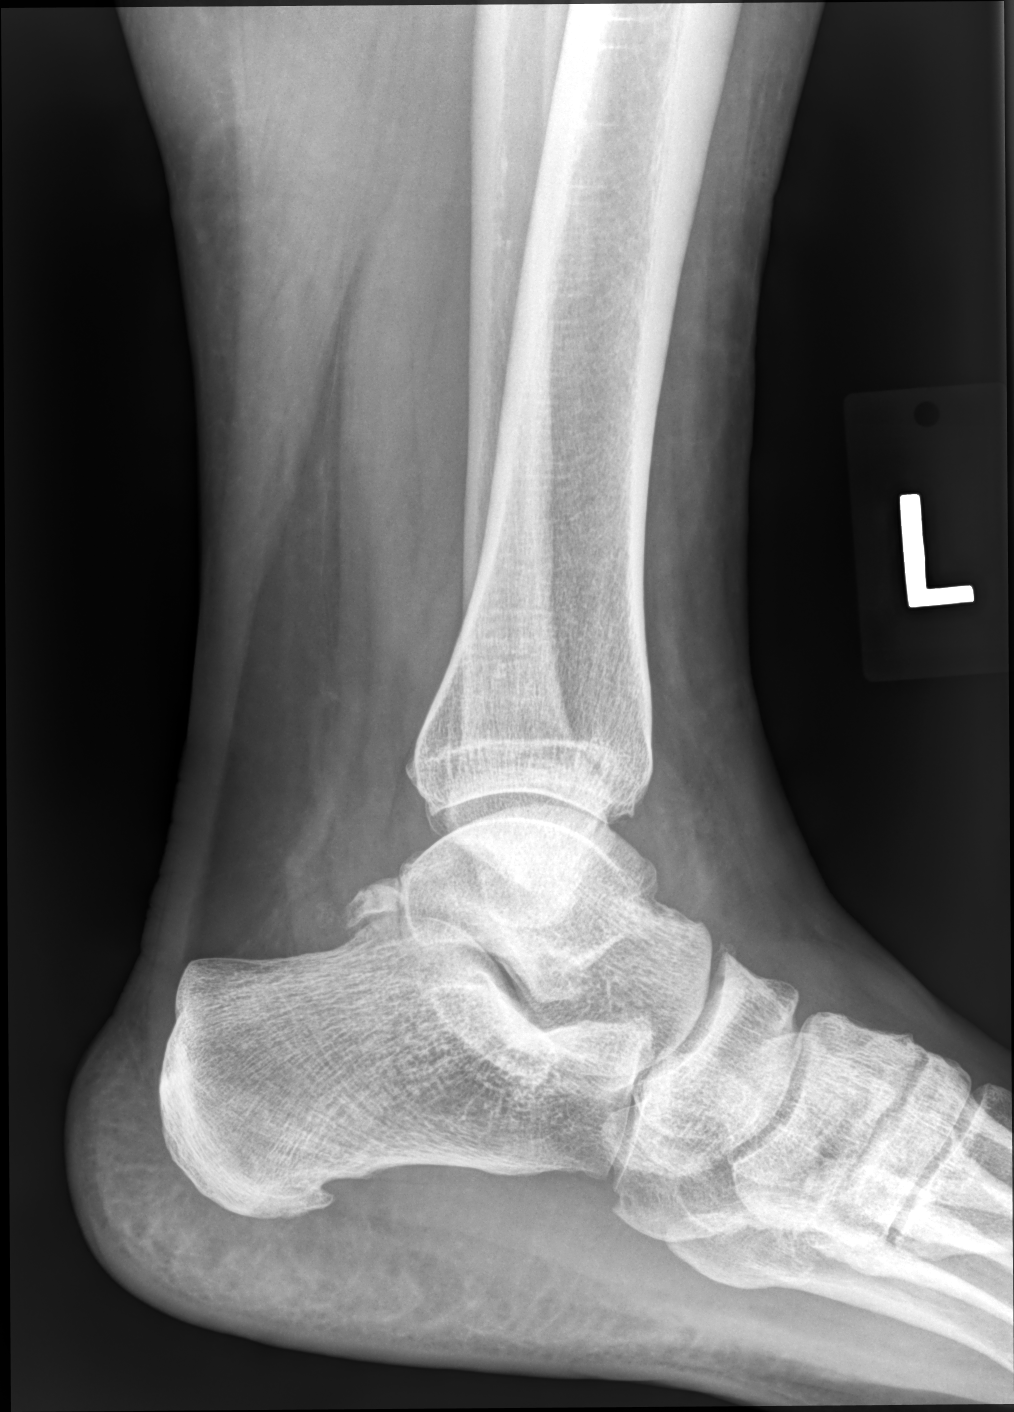

[3 of 3 positions shown; findings below may reference images not displayed]

FINDINGS: There is soft tissue swelling about the lateral malleolus. There is
no definite acute displaced fracture or dislocation. There is an os
trigonum. There is a small plantar calcaneal spur. There are mild
degenerative changes of the ankle mortise and midfoot.
IMPRESSION: Soft tissue swelling without evidence for an acute displaced
fracture or dislocation.

## 2022-12-10 DIAGNOSIS — R7303 Prediabetes: Secondary | ICD-10-CM | POA: Diagnosis not present

## 2022-12-10 DIAGNOSIS — M542 Cervicalgia: Secondary | ICD-10-CM | POA: Diagnosis not present

## 2022-12-10 DIAGNOSIS — G8929 Other chronic pain: Secondary | ICD-10-CM | POA: Diagnosis not present

## 2022-12-10 DIAGNOSIS — E7800 Pure hypercholesterolemia, unspecified: Secondary | ICD-10-CM | POA: Diagnosis not present

## 2022-12-10 DIAGNOSIS — M545 Low back pain, unspecified: Secondary | ICD-10-CM | POA: Diagnosis not present

## 2022-12-10 DIAGNOSIS — E611 Iron deficiency: Secondary | ICD-10-CM | POA: Diagnosis not present

## 2022-12-10 DIAGNOSIS — Z79899 Other long term (current) drug therapy: Secondary | ICD-10-CM | POA: Diagnosis not present

## 2022-12-10 DIAGNOSIS — F17210 Nicotine dependence, cigarettes, uncomplicated: Secondary | ICD-10-CM | POA: Diagnosis not present

## 2022-12-10 DIAGNOSIS — E559 Vitamin D deficiency, unspecified: Secondary | ICD-10-CM | POA: Diagnosis not present

## 2022-12-10 DIAGNOSIS — R0602 Shortness of breath: Secondary | ICD-10-CM | POA: Diagnosis not present

## 2022-12-12 DIAGNOSIS — Z79899 Other long term (current) drug therapy: Secondary | ICD-10-CM | POA: Diagnosis not present

## 2023-01-10 DIAGNOSIS — E7800 Pure hypercholesterolemia, unspecified: Secondary | ICD-10-CM | POA: Diagnosis not present

## 2023-01-10 DIAGNOSIS — E611 Iron deficiency: Secondary | ICD-10-CM | POA: Diagnosis not present

## 2023-01-10 DIAGNOSIS — G8929 Other chronic pain: Secondary | ICD-10-CM | POA: Diagnosis not present

## 2023-01-10 DIAGNOSIS — Z79899 Other long term (current) drug therapy: Secondary | ICD-10-CM | POA: Diagnosis not present

## 2023-01-10 DIAGNOSIS — R0602 Shortness of breath: Secondary | ICD-10-CM | POA: Diagnosis not present

## 2023-01-10 DIAGNOSIS — E559 Vitamin D deficiency, unspecified: Secondary | ICD-10-CM | POA: Diagnosis not present

## 2023-01-10 DIAGNOSIS — R7303 Prediabetes: Secondary | ICD-10-CM | POA: Diagnosis not present

## 2023-01-10 DIAGNOSIS — M545 Low back pain, unspecified: Secondary | ICD-10-CM | POA: Diagnosis not present

## 2023-01-10 DIAGNOSIS — M542 Cervicalgia: Secondary | ICD-10-CM | POA: Diagnosis not present

## 2023-01-14 DIAGNOSIS — Z79899 Other long term (current) drug therapy: Secondary | ICD-10-CM | POA: Diagnosis not present

## 2023-01-15 DIAGNOSIS — M545 Low back pain, unspecified: Secondary | ICD-10-CM | POA: Diagnosis not present

## 2023-01-15 DIAGNOSIS — M542 Cervicalgia: Secondary | ICD-10-CM | POA: Diagnosis not present

## 2023-02-05 DIAGNOSIS — R0602 Shortness of breath: Secondary | ICD-10-CM | POA: Diagnosis not present

## 2023-02-05 DIAGNOSIS — Z79899 Other long term (current) drug therapy: Secondary | ICD-10-CM | POA: Diagnosis not present

## 2023-02-05 DIAGNOSIS — R7303 Prediabetes: Secondary | ICD-10-CM | POA: Diagnosis not present

## 2023-02-05 DIAGNOSIS — M545 Low back pain, unspecified: Secondary | ICD-10-CM | POA: Diagnosis not present

## 2023-02-05 DIAGNOSIS — G8929 Other chronic pain: Secondary | ICD-10-CM | POA: Diagnosis not present

## 2023-02-05 DIAGNOSIS — M542 Cervicalgia: Secondary | ICD-10-CM | POA: Diagnosis not present

## 2023-02-05 DIAGNOSIS — E7800 Pure hypercholesterolemia, unspecified: Secondary | ICD-10-CM | POA: Diagnosis not present

## 2023-02-05 DIAGNOSIS — E559 Vitamin D deficiency, unspecified: Secondary | ICD-10-CM | POA: Diagnosis not present

## 2023-02-05 DIAGNOSIS — F17210 Nicotine dependence, cigarettes, uncomplicated: Secondary | ICD-10-CM | POA: Diagnosis not present

## 2023-02-07 DIAGNOSIS — Z79899 Other long term (current) drug therapy: Secondary | ICD-10-CM | POA: Diagnosis not present

## 2023-02-26 DIAGNOSIS — S060XAA Concussion with loss of consciousness status unknown, initial encounter: Secondary | ICD-10-CM | POA: Diagnosis not present

## 2023-03-07 DIAGNOSIS — R0602 Shortness of breath: Secondary | ICD-10-CM | POA: Diagnosis not present

## 2023-03-07 DIAGNOSIS — E7800 Pure hypercholesterolemia, unspecified: Secondary | ICD-10-CM | POA: Diagnosis not present

## 2023-03-07 DIAGNOSIS — G8929 Other chronic pain: Secondary | ICD-10-CM | POA: Diagnosis not present

## 2023-03-07 DIAGNOSIS — R7303 Prediabetes: Secondary | ICD-10-CM | POA: Diagnosis not present

## 2023-03-07 DIAGNOSIS — N183 Chronic kidney disease, stage 3 unspecified: Secondary | ICD-10-CM | POA: Diagnosis not present

## 2023-03-07 DIAGNOSIS — M542 Cervicalgia: Secondary | ICD-10-CM | POA: Diagnosis not present

## 2023-03-07 DIAGNOSIS — E559 Vitamin D deficiency, unspecified: Secondary | ICD-10-CM | POA: Diagnosis not present

## 2023-03-07 DIAGNOSIS — M545 Low back pain, unspecified: Secondary | ICD-10-CM | POA: Diagnosis not present

## 2023-03-07 DIAGNOSIS — Z79899 Other long term (current) drug therapy: Secondary | ICD-10-CM | POA: Diagnosis not present

## 2023-03-11 DIAGNOSIS — Z79899 Other long term (current) drug therapy: Secondary | ICD-10-CM | POA: Diagnosis not present

## 2023-04-05 DIAGNOSIS — R7303 Prediabetes: Secondary | ICD-10-CM | POA: Diagnosis not present

## 2023-04-05 DIAGNOSIS — M542 Cervicalgia: Secondary | ICD-10-CM | POA: Diagnosis not present

## 2023-04-05 DIAGNOSIS — F17210 Nicotine dependence, cigarettes, uncomplicated: Secondary | ICD-10-CM | POA: Diagnosis not present

## 2023-04-05 DIAGNOSIS — G8929 Other chronic pain: Secondary | ICD-10-CM | POA: Diagnosis not present

## 2023-04-05 DIAGNOSIS — M545 Low back pain, unspecified: Secondary | ICD-10-CM | POA: Diagnosis not present

## 2023-04-05 DIAGNOSIS — E559 Vitamin D deficiency, unspecified: Secondary | ICD-10-CM | POA: Diagnosis not present

## 2023-04-05 DIAGNOSIS — E611 Iron deficiency: Secondary | ICD-10-CM | POA: Diagnosis not present

## 2023-04-05 DIAGNOSIS — E7800 Pure hypercholesterolemia, unspecified: Secondary | ICD-10-CM | POA: Diagnosis not present

## 2023-04-05 DIAGNOSIS — Z79899 Other long term (current) drug therapy: Secondary | ICD-10-CM | POA: Diagnosis not present

## 2023-04-09 DIAGNOSIS — Z79899 Other long term (current) drug therapy: Secondary | ICD-10-CM | POA: Diagnosis not present

## 2023-05-23 DIAGNOSIS — Z0000 Encounter for general adult medical examination without abnormal findings: Secondary | ICD-10-CM | POA: Diagnosis not present

## 2023-05-23 DIAGNOSIS — G8929 Other chronic pain: Secondary | ICD-10-CM | POA: Diagnosis not present

## 2023-05-23 DIAGNOSIS — R7303 Prediabetes: Secondary | ICD-10-CM | POA: Diagnosis not present

## 2023-05-23 DIAGNOSIS — M545 Low back pain, unspecified: Secondary | ICD-10-CM | POA: Diagnosis not present

## 2023-05-23 DIAGNOSIS — M542 Cervicalgia: Secondary | ICD-10-CM | POA: Diagnosis not present

## 2023-05-23 DIAGNOSIS — N183 Chronic kidney disease, stage 3 unspecified: Secondary | ICD-10-CM | POA: Diagnosis not present

## 2023-05-23 DIAGNOSIS — E559 Vitamin D deficiency, unspecified: Secondary | ICD-10-CM | POA: Diagnosis not present

## 2023-05-23 DIAGNOSIS — Z711 Person with feared health complaint in whom no diagnosis is made: Secondary | ICD-10-CM | POA: Diagnosis not present

## 2023-05-23 DIAGNOSIS — E039 Hypothyroidism, unspecified: Secondary | ICD-10-CM | POA: Diagnosis not present

## 2023-05-23 DIAGNOSIS — Z79899 Other long term (current) drug therapy: Secondary | ICD-10-CM | POA: Diagnosis not present

## 2023-05-23 DIAGNOSIS — F17210 Nicotine dependence, cigarettes, uncomplicated: Secondary | ICD-10-CM | POA: Diagnosis not present

## 2023-05-23 DIAGNOSIS — E7800 Pure hypercholesterolemia, unspecified: Secondary | ICD-10-CM | POA: Diagnosis not present

## 2023-05-23 DIAGNOSIS — M129 Arthropathy, unspecified: Secondary | ICD-10-CM | POA: Diagnosis not present

## 2023-05-23 DIAGNOSIS — E611 Iron deficiency: Secondary | ICD-10-CM | POA: Diagnosis not present

## 2023-05-27 DIAGNOSIS — Z79899 Other long term (current) drug therapy: Secondary | ICD-10-CM | POA: Diagnosis not present

## 2023-06-24 DIAGNOSIS — G8929 Other chronic pain: Secondary | ICD-10-CM | POA: Diagnosis not present

## 2023-06-24 DIAGNOSIS — F17210 Nicotine dependence, cigarettes, uncomplicated: Secondary | ICD-10-CM | POA: Diagnosis not present

## 2023-06-24 DIAGNOSIS — R0602 Shortness of breath: Secondary | ICD-10-CM | POA: Diagnosis not present

## 2023-06-24 DIAGNOSIS — Z79899 Other long term (current) drug therapy: Secondary | ICD-10-CM | POA: Diagnosis not present

## 2023-06-24 DIAGNOSIS — M542 Cervicalgia: Secondary | ICD-10-CM | POA: Diagnosis not present

## 2023-06-24 DIAGNOSIS — E559 Vitamin D deficiency, unspecified: Secondary | ICD-10-CM | POA: Diagnosis not present

## 2023-06-24 DIAGNOSIS — R7303 Prediabetes: Secondary | ICD-10-CM | POA: Diagnosis not present

## 2023-06-24 DIAGNOSIS — E7800 Pure hypercholesterolemia, unspecified: Secondary | ICD-10-CM | POA: Diagnosis not present

## 2023-06-24 DIAGNOSIS — M545 Low back pain, unspecified: Secondary | ICD-10-CM | POA: Diagnosis not present

## 2023-06-27 DIAGNOSIS — Z79899 Other long term (current) drug therapy: Secondary | ICD-10-CM | POA: Diagnosis not present

## 2023-07-17 DIAGNOSIS — Z79899 Other long term (current) drug therapy: Secondary | ICD-10-CM | POA: Diagnosis not present

## 2023-07-17 DIAGNOSIS — F17210 Nicotine dependence, cigarettes, uncomplicated: Secondary | ICD-10-CM | POA: Diagnosis not present

## 2023-07-17 DIAGNOSIS — M542 Cervicalgia: Secondary | ICD-10-CM | POA: Diagnosis not present

## 2023-07-17 DIAGNOSIS — R7303 Prediabetes: Secondary | ICD-10-CM | POA: Diagnosis not present

## 2023-07-17 DIAGNOSIS — R0602 Shortness of breath: Secondary | ICD-10-CM | POA: Diagnosis not present

## 2023-07-17 DIAGNOSIS — E7800 Pure hypercholesterolemia, unspecified: Secondary | ICD-10-CM | POA: Diagnosis not present

## 2023-07-17 DIAGNOSIS — E611 Iron deficiency: Secondary | ICD-10-CM | POA: Diagnosis not present

## 2023-07-17 DIAGNOSIS — G8929 Other chronic pain: Secondary | ICD-10-CM | POA: Diagnosis not present

## 2023-07-17 DIAGNOSIS — M545 Low back pain, unspecified: Secondary | ICD-10-CM | POA: Diagnosis not present

## 2023-07-18 DIAGNOSIS — Z79899 Other long term (current) drug therapy: Secondary | ICD-10-CM | POA: Diagnosis not present

## 2023-08-07 DIAGNOSIS — M2550 Pain in unspecified joint: Secondary | ICD-10-CM | POA: Diagnosis not present

## 2023-08-07 DIAGNOSIS — M549 Dorsalgia, unspecified: Secondary | ICD-10-CM | POA: Diagnosis not present

## 2023-08-07 DIAGNOSIS — G8929 Other chronic pain: Secondary | ICD-10-CM | POA: Diagnosis not present

## 2023-08-19 DIAGNOSIS — M542 Cervicalgia: Secondary | ICD-10-CM | POA: Diagnosis not present

## 2023-08-19 DIAGNOSIS — E611 Iron deficiency: Secondary | ICD-10-CM | POA: Diagnosis not present

## 2023-08-19 DIAGNOSIS — E7800 Pure hypercholesterolemia, unspecified: Secondary | ICD-10-CM | POA: Diagnosis not present

## 2023-08-19 DIAGNOSIS — E559 Vitamin D deficiency, unspecified: Secondary | ICD-10-CM | POA: Diagnosis not present

## 2023-08-19 DIAGNOSIS — F17210 Nicotine dependence, cigarettes, uncomplicated: Secondary | ICD-10-CM | POA: Diagnosis not present

## 2023-08-19 DIAGNOSIS — R7303 Prediabetes: Secondary | ICD-10-CM | POA: Diagnosis not present

## 2023-08-19 DIAGNOSIS — M545 Low back pain, unspecified: Secondary | ICD-10-CM | POA: Diagnosis not present

## 2023-08-19 DIAGNOSIS — Z79899 Other long term (current) drug therapy: Secondary | ICD-10-CM | POA: Diagnosis not present

## 2023-08-19 DIAGNOSIS — N183 Chronic kidney disease, stage 3 unspecified: Secondary | ICD-10-CM | POA: Diagnosis not present

## 2023-08-19 DIAGNOSIS — G8929 Other chronic pain: Secondary | ICD-10-CM | POA: Diagnosis not present

## 2023-08-21 DIAGNOSIS — Z79899 Other long term (current) drug therapy: Secondary | ICD-10-CM | POA: Diagnosis not present

## 2023-09-17 DIAGNOSIS — E7800 Pure hypercholesterolemia, unspecified: Secondary | ICD-10-CM | POA: Diagnosis not present

## 2023-09-17 DIAGNOSIS — N183 Chronic kidney disease, stage 3 unspecified: Secondary | ICD-10-CM | POA: Diagnosis not present

## 2023-09-17 DIAGNOSIS — E611 Iron deficiency: Secondary | ICD-10-CM | POA: Diagnosis not present

## 2023-09-17 DIAGNOSIS — M542 Cervicalgia: Secondary | ICD-10-CM | POA: Diagnosis not present

## 2023-09-17 DIAGNOSIS — M545 Low back pain, unspecified: Secondary | ICD-10-CM | POA: Diagnosis not present

## 2023-09-17 DIAGNOSIS — R7303 Prediabetes: Secondary | ICD-10-CM | POA: Diagnosis not present

## 2023-09-17 DIAGNOSIS — F17210 Nicotine dependence, cigarettes, uncomplicated: Secondary | ICD-10-CM | POA: Diagnosis not present

## 2023-09-17 DIAGNOSIS — G8929 Other chronic pain: Secondary | ICD-10-CM | POA: Diagnosis not present

## 2023-09-17 DIAGNOSIS — Z79899 Other long term (current) drug therapy: Secondary | ICD-10-CM | POA: Diagnosis not present

## 2023-09-19 DIAGNOSIS — Z79899 Other long term (current) drug therapy: Secondary | ICD-10-CM | POA: Diagnosis not present

## 2023-11-07 DIAGNOSIS — E559 Vitamin D deficiency, unspecified: Secondary | ICD-10-CM | POA: Diagnosis not present

## 2023-11-07 DIAGNOSIS — Z79899 Other long term (current) drug therapy: Secondary | ICD-10-CM | POA: Diagnosis not present

## 2023-11-07 DIAGNOSIS — M542 Cervicalgia: Secondary | ICD-10-CM | POA: Diagnosis not present

## 2023-11-07 DIAGNOSIS — R7303 Prediabetes: Secondary | ICD-10-CM | POA: Diagnosis not present

## 2023-11-07 DIAGNOSIS — M545 Low back pain, unspecified: Secondary | ICD-10-CM | POA: Diagnosis not present

## 2023-11-07 DIAGNOSIS — F17210 Nicotine dependence, cigarettes, uncomplicated: Secondary | ICD-10-CM | POA: Diagnosis not present

## 2023-11-07 DIAGNOSIS — E7800 Pure hypercholesterolemia, unspecified: Secondary | ICD-10-CM | POA: Diagnosis not present

## 2023-11-07 DIAGNOSIS — G8929 Other chronic pain: Secondary | ICD-10-CM | POA: Diagnosis not present

## 2023-11-11 DIAGNOSIS — Z79899 Other long term (current) drug therapy: Secondary | ICD-10-CM | POA: Diagnosis not present

## 2023-12-05 DIAGNOSIS — S86912A Strain of unspecified muscle(s) and tendon(s) at lower leg level, left leg, initial encounter: Secondary | ICD-10-CM | POA: Diagnosis not present

## 2023-12-05 DIAGNOSIS — M25369 Other instability, unspecified knee: Secondary | ICD-10-CM | POA: Diagnosis not present

## 2023-12-09 DIAGNOSIS — E559 Vitamin D deficiency, unspecified: Secondary | ICD-10-CM | POA: Diagnosis not present

## 2023-12-09 DIAGNOSIS — M129 Arthropathy, unspecified: Secondary | ICD-10-CM | POA: Diagnosis not present

## 2023-12-09 DIAGNOSIS — G8929 Other chronic pain: Secondary | ICD-10-CM | POA: Diagnosis not present

## 2023-12-09 DIAGNOSIS — N183 Chronic kidney disease, stage 3 unspecified: Secondary | ICD-10-CM | POA: Diagnosis not present

## 2023-12-09 DIAGNOSIS — E7800 Pure hypercholesterolemia, unspecified: Secondary | ICD-10-CM | POA: Diagnosis not present

## 2023-12-09 DIAGNOSIS — R0602 Shortness of breath: Secondary | ICD-10-CM | POA: Diagnosis not present

## 2023-12-09 DIAGNOSIS — R7303 Prediabetes: Secondary | ICD-10-CM | POA: Diagnosis not present

## 2023-12-09 DIAGNOSIS — M542 Cervicalgia: Secondary | ICD-10-CM | POA: Diagnosis not present

## 2023-12-09 DIAGNOSIS — M545 Low back pain, unspecified: Secondary | ICD-10-CM | POA: Diagnosis not present

## 2023-12-09 DIAGNOSIS — F17210 Nicotine dependence, cigarettes, uncomplicated: Secondary | ICD-10-CM | POA: Diagnosis not present

## 2023-12-09 DIAGNOSIS — E611 Iron deficiency: Secondary | ICD-10-CM | POA: Diagnosis not present

## 2023-12-09 DIAGNOSIS — Z79899 Other long term (current) drug therapy: Secondary | ICD-10-CM | POA: Diagnosis not present

## 2023-12-12 DIAGNOSIS — Z79899 Other long term (current) drug therapy: Secondary | ICD-10-CM | POA: Diagnosis not present

## 2024-01-09 DIAGNOSIS — E7800 Pure hypercholesterolemia, unspecified: Secondary | ICD-10-CM | POA: Diagnosis not present

## 2024-01-09 DIAGNOSIS — M545 Low back pain, unspecified: Secondary | ICD-10-CM | POA: Diagnosis not present

## 2024-01-09 DIAGNOSIS — Z79899 Other long term (current) drug therapy: Secondary | ICD-10-CM | POA: Diagnosis not present

## 2024-01-09 DIAGNOSIS — E611 Iron deficiency: Secondary | ICD-10-CM | POA: Diagnosis not present

## 2024-01-09 DIAGNOSIS — R7303 Prediabetes: Secondary | ICD-10-CM | POA: Diagnosis not present

## 2024-01-09 DIAGNOSIS — M542 Cervicalgia: Secondary | ICD-10-CM | POA: Diagnosis not present

## 2024-01-09 DIAGNOSIS — R0602 Shortness of breath: Secondary | ICD-10-CM | POA: Diagnosis not present

## 2024-01-09 DIAGNOSIS — G8929 Other chronic pain: Secondary | ICD-10-CM | POA: Diagnosis not present

## 2024-01-09 DIAGNOSIS — F17210 Nicotine dependence, cigarettes, uncomplicated: Secondary | ICD-10-CM | POA: Diagnosis not present

## 2024-02-04 DIAGNOSIS — M545 Low back pain, unspecified: Secondary | ICD-10-CM | POA: Diagnosis not present

## 2024-02-04 DIAGNOSIS — M542 Cervicalgia: Secondary | ICD-10-CM | POA: Diagnosis not present

## 2024-02-10 DIAGNOSIS — R490 Dysphonia: Secondary | ICD-10-CM | POA: Diagnosis not present

## 2024-02-10 DIAGNOSIS — Z85850 Personal history of malignant neoplasm of thyroid: Secondary | ICD-10-CM | POA: Diagnosis not present

## 2024-02-10 DIAGNOSIS — J383 Other diseases of vocal cords: Secondary | ICD-10-CM | POA: Diagnosis not present

## 2024-02-10 DIAGNOSIS — R221 Localized swelling, mass and lump, neck: Secondary | ICD-10-CM | POA: Diagnosis not present

## 2024-02-10 DIAGNOSIS — R09A2 Foreign body sensation, throat: Secondary | ICD-10-CM | POA: Diagnosis not present

## 2024-02-10 DIAGNOSIS — K219 Gastro-esophageal reflux disease without esophagitis: Secondary | ICD-10-CM | POA: Diagnosis not present

## 2024-02-14 DIAGNOSIS — F17210 Nicotine dependence, cigarettes, uncomplicated: Secondary | ICD-10-CM | POA: Diagnosis not present

## 2024-02-14 DIAGNOSIS — E611 Iron deficiency: Secondary | ICD-10-CM | POA: Diagnosis not present

## 2024-02-14 DIAGNOSIS — M542 Cervicalgia: Secondary | ICD-10-CM | POA: Diagnosis not present

## 2024-02-14 DIAGNOSIS — R0602 Shortness of breath: Secondary | ICD-10-CM | POA: Diagnosis not present

## 2024-02-14 DIAGNOSIS — E7800 Pure hypercholesterolemia, unspecified: Secondary | ICD-10-CM | POA: Diagnosis not present

## 2024-02-14 DIAGNOSIS — R7303 Prediabetes: Secondary | ICD-10-CM | POA: Diagnosis not present

## 2024-02-14 DIAGNOSIS — Z79899 Other long term (current) drug therapy: Secondary | ICD-10-CM | POA: Diagnosis not present

## 2024-02-14 DIAGNOSIS — G8929 Other chronic pain: Secondary | ICD-10-CM | POA: Diagnosis not present

## 2024-02-14 DIAGNOSIS — M545 Low back pain, unspecified: Secondary | ICD-10-CM | POA: Diagnosis not present

## 2024-02-17 DIAGNOSIS — Z79899 Other long term (current) drug therapy: Secondary | ICD-10-CM | POA: Diagnosis not present
